# Patient Record
Sex: Male | Born: 1950 | Race: Black or African American | Smoking: Former smoker
Health system: Southern US, Community
[De-identification: ages and names within clinical notes are randomized; demographics above are authoritative.]

## PROBLEM LIST (undated history)

## (undated) DIAGNOSIS — G4730 Sleep apnea, unspecified: Secondary | ICD-10-CM

## (undated) DIAGNOSIS — E785 Hyperlipidemia, unspecified: Secondary | ICD-10-CM

## (undated) DIAGNOSIS — Z86711 Personal history of pulmonary embolism: Secondary | ICD-10-CM

## (undated) DIAGNOSIS — Z86718 Personal history of other venous thrombosis and embolism: Secondary | ICD-10-CM

## (undated) DIAGNOSIS — Z87442 Personal history of urinary calculi: Secondary | ICD-10-CM

## (undated) DIAGNOSIS — I10 Essential (primary) hypertension: Secondary | ICD-10-CM

## (undated) DIAGNOSIS — M1990 Unspecified osteoarthritis, unspecified site: Secondary | ICD-10-CM

## (undated) HISTORY — DX: Hyperlipidemia, unspecified: E78.5

---

## 2013-05-05 DIAGNOSIS — E669 Obesity, unspecified: Secondary | ICD-10-CM | POA: Insufficient documentation

## 2013-06-06 DIAGNOSIS — I10 Essential (primary) hypertension: Secondary | ICD-10-CM | POA: Insufficient documentation

## 2014-04-17 ENCOUNTER — Ambulatory Visit: Payer: Medicaid Other | Attending: Anesthesiology | Admitting: Physical Therapy

## 2014-04-17 DIAGNOSIS — I10 Essential (primary) hypertension: Secondary | ICD-10-CM | POA: Diagnosis not present

## 2014-04-17 DIAGNOSIS — M545 Low back pain, unspecified: Secondary | ICD-10-CM | POA: Insufficient documentation

## 2014-04-17 DIAGNOSIS — R262 Difficulty in walking, not elsewhere classified: Secondary | ICD-10-CM | POA: Insufficient documentation

## 2014-04-17 DIAGNOSIS — M25569 Pain in unspecified knee: Secondary | ICD-10-CM | POA: Diagnosis not present

## 2014-04-17 DIAGNOSIS — IMO0001 Reserved for inherently not codable concepts without codable children: Secondary | ICD-10-CM | POA: Diagnosis present

## 2015-05-29 ENCOUNTER — Other Ambulatory Visit: Payer: Self-pay | Admitting: Orthopedic Surgery

## 2015-06-13 ENCOUNTER — Ambulatory Visit (HOSPITAL_COMMUNITY)
Admission: RE | Admit: 2015-06-13 | Discharge: 2015-06-13 | Disposition: A | Discharge status: Home / Self Care | Payer: Medicaid Other | Source: Ambulatory Visit | Attending: Orthopedic Surgery | Admitting: Orthopedic Surgery

## 2015-06-13 ENCOUNTER — Encounter (HOSPITAL_COMMUNITY): Payer: Self-pay

## 2015-06-13 ENCOUNTER — Encounter (HOSPITAL_COMMUNITY)
Admission: RE | Admit: 2015-06-13 | Discharge: 2015-06-13 | Disposition: A | Discharge status: Home / Self Care | Payer: Medicaid Other | Source: Ambulatory Visit | Attending: Orthopedic Surgery | Admitting: Orthopedic Surgery

## 2015-06-13 DIAGNOSIS — Z01818 Encounter for other preprocedural examination: Secondary | ICD-10-CM | POA: Diagnosis present

## 2015-06-13 DIAGNOSIS — Z0183 Encounter for blood typing: Secondary | ICD-10-CM | POA: Insufficient documentation

## 2015-06-13 DIAGNOSIS — Z01812 Encounter for preprocedural laboratory examination: Secondary | ICD-10-CM | POA: Insufficient documentation

## 2015-06-13 DIAGNOSIS — M1712 Unilateral primary osteoarthritis, left knee: Secondary | ICD-10-CM | POA: Insufficient documentation

## 2015-06-13 DIAGNOSIS — Z01810 Encounter for preprocedural cardiovascular examination: Secondary | ICD-10-CM | POA: Insufficient documentation

## 2015-06-13 HISTORY — DX: Unspecified osteoarthritis, unspecified site: M19.90

## 2015-06-13 HISTORY — DX: Sleep apnea, unspecified: G47.30

## 2015-06-13 HISTORY — DX: Essential (primary) hypertension: I10

## 2015-06-13 LAB — CBC WITH DIFFERENTIAL/PLATELET
BASOS ABS: 0.1 10*3/uL (ref 0.0–0.1)
Basophils Relative: 2 % — ABNORMAL HIGH (ref 0–1)
EOS PCT: 4 % (ref 0–5)
Eosinophils Absolute: 0.2 10*3/uL (ref 0.0–0.7)
HCT: 43 % (ref 39.0–52.0)
HEMOGLOBIN: 14.5 g/dL (ref 13.0–17.0)
Lymphocytes Relative: 30 % (ref 12–46)
Lymphs Abs: 1.4 10*3/uL (ref 0.7–4.0)
MCH: 28 pg (ref 26.0–34.0)
MCHC: 33.7 g/dL (ref 30.0–36.0)
MCV: 83 fL (ref 78.0–100.0)
MONO ABS: 0.5 10*3/uL (ref 0.1–1.0)
Monocytes Relative: 10 % (ref 3–12)
NEUTROS PCT: 54 % (ref 43–77)
Neutro Abs: 2.5 10*3/uL (ref 1.7–7.7)
PLATELETS: 236 10*3/uL (ref 150–400)
RBC: 5.18 MIL/uL (ref 4.22–5.81)
RDW: 14.4 % (ref 11.5–15.5)
WBC: 4.6 10*3/uL (ref 4.0–10.5)

## 2015-06-13 LAB — URINALYSIS, ROUTINE W REFLEX MICROSCOPIC
Bilirubin Urine: NEGATIVE
Glucose, UA: NEGATIVE mg/dL
Hgb urine dipstick: NEGATIVE
KETONES UR: NEGATIVE mg/dL
Leukocytes, UA: NEGATIVE
Nitrite: NEGATIVE
PH: 6 (ref 5.0–8.0)
PROTEIN: NEGATIVE mg/dL
Specific Gravity, Urine: 1.018 (ref 1.005–1.030)
UROBILINOGEN UA: 1 mg/dL (ref 0.0–1.0)

## 2015-06-13 LAB — ABO/RH: ABO/RH(D): O POS

## 2015-06-13 LAB — PROTIME-INR
INR: 1.07 (ref 0.00–1.49)
PROTHROMBIN TIME: 14.1 s (ref 11.6–15.2)

## 2015-06-13 LAB — BASIC METABOLIC PANEL
Anion gap: 9 (ref 5–15)
BUN: 13 mg/dL (ref 6–20)
CO2: 26 mmol/L (ref 22–32)
Calcium: 9.3 mg/dL (ref 8.9–10.3)
Chloride: 102 mmol/L (ref 101–111)
Creatinine, Ser: 0.85 mg/dL (ref 0.61–1.24)
GFR calc non Af Amer: >60 mL/min (ref >60)
GLUCOSE: 97 mg/dL (ref 65–99)
POTASSIUM: 4.1 mmol/L (ref 3.5–5.1)
SODIUM: 137 mmol/L (ref 135–145)

## 2015-06-13 LAB — TYPE AND SCREEN
ABO/RH(D): O POS
Antibody Screen: NEGATIVE

## 2015-06-13 LAB — SURGICAL PCR SCREEN
MRSA, PCR: NEGATIVE
Staphylococcus aureus: NEGATIVE

## 2015-06-13 LAB — APTT: APTT: 34 s (ref 24–37)

## 2015-06-13 NOTE — Pre-Procedure Instructions (Addendum)
Tyler Maynard  06/13/2015     No Pharmacies Listed   Your procedure is scheduled on 06/25/15.  Report to [facility] short stay admitting at 1045 A.M.  Call this number if you have problems the morning of surgery:  [phone]   Remember:  Do not eat food or drink liquids after midnight.  Take these medicines the morning of surgery with A SIP OF WATER pain med     STOP all herbel meds, nsaids (aleve,naproxen,advil,ibuprofen) 5 days prior to surgery starting 06/20/15 including vitamins,aspirin,voltaren gel   Do not wear jewelry, make-up or nail polish.  Do not wear lotions, powders, or perfumes.  You may wear deodorant.  Do not shave 48 hours prior to surgery.  Men may shave face and neck.  Do not bring valuables to the hospital.  [facility] is not responsible for any belongings or valuables.  Contacts, dentures or bridgework may not be worn into surgery.  Leave your suitcase in the car.  After surgery it may be brought to your room.  For patients admitted to the hospital, discharge time will be determined by your treatment team.  Patients discharged the day of surgery will not be allowed to drive home.   Name and phone number of your driver:    Special instructions:   Special Instructions: [place] - Preparing for Surgery  Before surgery, you can play an important role.  Because skin is not sterile, your skin needs to be as free of germs as possible.  You can reduce the number of germs on you skin by washing with CHG (chlorahexidine gluconate) soap before surgery.  CHG is an antiseptic cleaner which kills germs and bonds with the skin to continue killing germs even after washing.  Please DO NOT use if you have an allergy to CHG or antibacterial soaps.  If your skin becomes reddened/irritated stop using the CHG and inform your nurse when you arrive at Short Stay.  Do not shave (including legs and underarms) for at least 48 hours prior to the first CHG shower.  You may shave  your face.  Please follow these instructions carefully:   1.  Shower with CHG Soap the night before surgery and the morning of Surgery.  2.  If you choose to wash your hair, wash your hair first as usual with your normal shampoo.  3.  After you shampoo, rinse your hair and body thoroughly to remove the Shampoo.  4.  Use CHG as you would any other liquid soap.  You can apply chg directly  to the skin and wash gently with scrungie or a clean washcloth.  5.  Apply the CHG Soap to your body ONLY FROM THE NECK DOWN.  Do not use on open wounds or open sores.  Avoid contact with your eyes ears, mouth and genitals (private parts).  Wash genitals (private parts)       with your normal soap.  6.  Wash thoroughly, paying special attention to the area where your surgery will be performed.  7.  Thoroughly rinse your body with warm water from the neck down.  8.  DO NOT shower/wash with your normal soap after using and rinsing off the CHG Soap.  9.  Pat yourself dry with a clean towel.            10.  Wear clean pajamas.            11.  Place clean sheets on your bed the night  of your first shower and do not sleep with pets.  Day of Surgery  Do not apply any lotions/deodorants the morning of surgery.  Please wear clean clothes to the hospital/surgery center.  Please read over the following fact sheets that you were given. Pain Booklet, Coughing and Deep Breathing, Blood Transfusion Information, MRSA Information and Surgical Site Infection Prevention

## 2015-06-13 NOTE — Progress Notes (Signed)
req'd sleep study from [facility] done 3 months ago- borderline.pat has lost 15 lbs

## 2015-06-16 NOTE — Progress Notes (Signed)
Left message with [facility] requesting sleep study

## 2015-06-19 NOTE — Progress Notes (Signed)
Re-requested sleep study from [facility]

## 2015-06-24 MED ORDER — DEXTROSE-NACL 5-0.45 % IV SOLN: INTRAVENOUS | Status: DC

## 2015-06-24 MED ORDER — DEXTROSE 5 % IV SOLN
3.0000 g | INTRAVENOUS | Status: AC
  Administered 2015-06-25: 3 g via INTRAVENOUS
  Filled 2015-06-24: qty 3000

## 2015-06-24 NOTE — H&P (Signed)
TOTAL KNEE ADMISSION H&P  Patient is being admitted for left total knee arthroplasty.  Subjective:  Chief Complaint:left knee pain.  HPI: Tyler Maynard, 64 y.o. male, has a history of pain and functional disability in the left knee due to arthritis and has failed non-surgical conservative treatments for greater than 12 weeks to includeNSAID's and/or analgesics, corticosteriod injections, viscosupplementation injections, flexibility and strengthening excercises, weight reduction as appropriate and activity modification.  Onset of symptoms was gradual, starting 10 years ago with gradually worsening course since that time. The patient noted no past surgery on the left knee(s).  Patient currently rates pain in the left knee(s) at 10 out of 10 with activity. Patient has night pain, worsening of pain with activity and weight bearing, pain that interferes with activities of daily living and pain with passive range of motion.  Patient has evidence of subchondral sclerosis, periarticular osteophytes, joint subluxation and joint space narrowing by imaging studies.  There is no active infection.  There are no active problems to display for this patient.  Past Medical History  Diagnosis Date  . Hypertension   . Sleep apnea     borderline-lost weight  . Arthritis     No past surgical history on file.  No prescriptions prior to admission   No Known Allergies  History  Substance Use Topics  . Smoking status: Former Smoker -- 1.00 packs/day for 10 years    Types: Cigarettes    Quit date: 06/13/1995  . Smokeless tobacco: Not on file  . Alcohol Use: No    No family history on file.   Review of Systems  Constitutional: Positive for weight loss.  HENT: Negative.   Eyes: Negative.   Respiratory: Positive for shortness of breath.   Cardiovascular: Negative.   Gastrointestinal: Negative.   Genitourinary:       ED  Musculoskeletal: Positive for joint pain.  Skin: Negative.   Neurological:  Negative.   Psychiatric/Behavioral: Negative.     Objective:  Physical Exam  Constitutional: He is oriented to person, place, and time. He appears well-developed and well-nourished.  HENT:  Head: Normocephalic and atraumatic.  Eyes: Pupils are equal, round, and reactive to light.  Neck: Normal range of motion. Neck supple.  Cardiovascular: Intact distal pulses.   Respiratory: Effort normal.  Musculoskeletal: He exhibits tenderness.  Patient has impressive bowlegged deformities bilaterally.  Bilateral 10 forward flexion contractures and flex 210 bilaterally.  Skin is intact.  One plus effusion on the left no effusion on the right exquisitely tender along the medial joint lines and there is grinding crepitus as you taken through a range of motion to either knee.  Neurological: He is alert and oriented to person, place, and time.  Skin: Skin is warm and dry.  Psychiatric: He has a normal mood and affect. His behavior is normal. Judgment and thought content normal.    Vital signs in last 24 hours:    Labs:   There is no height or weight on file to calculate BMI.   Imaging Review Plain radiographs demonstrate bilateral 5-10 varus deformities bone-on-bone subluxation of tibias lateral to the femurs, 5 mm.  The varus deformities.  She show some erosion of the tibias quite impressive.  There are some posterior superior osteophytes off the femur.  There also peripheral osteophytes on the patella with loss of cartilage height.  Assessment/Plan:  End stage arthritis, left knee   The patient history, physical examination, clinical judgment of the provider and imaging studies are consistent with  end stage degenerative joint disease of the left knee(s) and total knee arthroplasty is deemed medically necessary. The treatment options including medical management, injection therapy arthroscopy and arthroplasty were discussed at length. The risks and benefits of total knee arthroplasty were  presented and reviewed. The risks due to aseptic loosening, infection, stiffness, patella tracking problems, thromboembolic complications and other imponderables were discussed. The patient acknowledged the explanation, agreed to proceed with the plan and consent was signed. Patient is being admitted for inpatient treatment for surgery, pain control, PT, OT, prophylactic antibiotics, VTE prophylaxis, progressive ambulation and ADL's and discharge planning. The patient is planning to be discharged home with home health services

## 2015-06-25 ENCOUNTER — Inpatient Hospital Stay (HOSPITAL_COMMUNITY): Payer: Medicaid Other | Admitting: Anesthesiology

## 2015-06-25 ENCOUNTER — Inpatient Hospital Stay (HOSPITAL_COMMUNITY)
Admission: RE | Admit: 2015-06-25 | Discharge: 2015-06-27 | DRG: 470 | Disposition: A | Discharge status: Home Health | Payer: Medicaid Other | Source: Ambulatory Visit | Attending: Orthopedic Surgery | Admitting: Orthopedic Surgery

## 2015-06-25 ENCOUNTER — Encounter (HOSPITAL_COMMUNITY)
Admission: RE | Disposition: A | Discharge status: Home Health | Payer: Self-pay | Source: Ambulatory Visit | Attending: Orthopedic Surgery

## 2015-06-25 ENCOUNTER — Encounter (HOSPITAL_COMMUNITY): Payer: Self-pay | Admitting: *Deleted

## 2015-06-25 DIAGNOSIS — M1712 Unilateral primary osteoarthritis, left knee: Principal | ICD-10-CM | POA: Diagnosis present

## 2015-06-25 DIAGNOSIS — M1711 Unilateral primary osteoarthritis, right knee: Secondary | ICD-10-CM | POA: Diagnosis present

## 2015-06-25 DIAGNOSIS — I10 Essential (primary) hypertension: Secondary | ICD-10-CM | POA: Diagnosis not present

## 2015-06-25 DIAGNOSIS — G4730 Sleep apnea, unspecified: Secondary | ICD-10-CM | POA: Diagnosis not present

## 2015-06-25 DIAGNOSIS — Z7982 Long term (current) use of aspirin: Secondary | ICD-10-CM

## 2015-06-25 DIAGNOSIS — Z87891 Personal history of nicotine dependence: Secondary | ICD-10-CM

## 2015-06-25 DIAGNOSIS — D62 Acute posthemorrhagic anemia: Secondary | ICD-10-CM | POA: Diagnosis not present

## 2015-06-25 DIAGNOSIS — Z79899 Other long term (current) drug therapy: Secondary | ICD-10-CM | POA: Diagnosis not present

## 2015-06-25 DIAGNOSIS — M1710 Unilateral primary osteoarthritis, unspecified knee: Secondary | ICD-10-CM | POA: Diagnosis present

## 2015-06-25 DIAGNOSIS — M25562 Pain in left knee: Secondary | ICD-10-CM | POA: Diagnosis present

## 2015-06-25 HISTORY — PX: TOTAL KNEE ARTHROPLASTY: SHX125

## 2015-06-25 SURGERY — ARTHROPLASTY, KNEE, TOTAL
Anesthesia: Spinal | Site: Knee | Laterality: Left

## 2015-06-25 MED ORDER — LACTATED RINGERS IV SOLN
INTRAVENOUS | Status: DC | PRN
  Administered 2015-06-25 (×2): via INTRAVENOUS

## 2015-06-25 MED ORDER — HYDROMORPHONE HCL 1 MG/ML IJ SOLN
INTRAMUSCULAR | Status: AC
  Filled 2015-06-25: qty 1

## 2015-06-25 MED ORDER — METHOCARBAMOL 500 MG PO TABS
500.0000 mg | ORAL_TABLET | Freq: Four times a day (QID) | ORAL | Status: DC | PRN
  Administered 2015-06-25 – 2015-06-27 (×7): 500 mg via ORAL
  Filled 2015-06-25 (×6): qty 1

## 2015-06-25 MED ORDER — PROPOFOL 10 MG/ML IV BOLUS
INTRAVENOUS | Status: AC
  Filled 2015-06-25: qty 20

## 2015-06-25 MED ORDER — LIDOCAINE HCL (CARDIAC) 20 MG/ML IV SOLN
INTRAVENOUS | Status: AC
  Filled 2015-06-25: qty 5

## 2015-06-25 MED ORDER — CEFUROXIME SODIUM 1.5 G IJ SOLR
INTRAMUSCULAR | Status: DC | PRN
  Administered 2015-06-25: 1.5 g

## 2015-06-25 MED ORDER — FLEET ENEMA 7-19 GM/118ML RE ENEM: 1.0000 | ENEMA | Freq: Once | RECTAL | Status: AC | PRN

## 2015-06-25 MED ORDER — ACETAMINOPHEN 325 MG PO TABS
650.0000 mg | ORAL_TABLET | Freq: Four times a day (QID) | ORAL | Status: DC | PRN
  Administered 2015-06-26 – 2015-06-27 (×2): 650 mg via ORAL
  Filled 2015-06-25 (×2): qty 2

## 2015-06-25 MED ORDER — DEXTROSE 5 % IV SOLN: 500.0000 mg | Freq: Four times a day (QID) | INTRAVENOUS | Status: DC | PRN

## 2015-06-25 MED ORDER — METOCLOPRAMIDE HCL 5 MG PO TABS
5.0000 mg | ORAL_TABLET | Freq: Three times a day (TID) | ORAL | Status: DC | PRN
Start: 2015-06-25 — End: 2015-06-27

## 2015-06-25 MED ORDER — HYDROMORPHONE HCL 1 MG/ML IJ SOLN
0.5000 mg | INTRAMUSCULAR | Status: DC | PRN
  Administered 2015-06-25 – 2015-06-26 (×8): 1 mg via INTRAVENOUS
  Filled 2015-06-25 (×8): qty 1

## 2015-06-25 MED ORDER — MENTHOL 3 MG MT LOZG
1.0000 | LOZENGE | OROMUCOSAL | Status: DC | PRN
Start: 2015-06-25 — End: 2015-06-27

## 2015-06-25 MED ORDER — ONDANSETRON HCL 4 MG PO TABS: 4.0000 mg | ORAL_TABLET | Freq: Four times a day (QID) | ORAL | Status: DC | PRN

## 2015-06-25 MED ORDER — PHENOL 1.4 % MT LIQD: 1.0000 | OROMUCOSAL | Status: DC | PRN

## 2015-06-25 MED ORDER — ACETAMINOPHEN 650 MG RE SUPP: 650.0000 mg | Freq: Four times a day (QID) | RECTAL | Status: DC | PRN

## 2015-06-25 MED ORDER — METHOCARBAMOL 500 MG PO TABS: 500.0000 mg | ORAL_TABLET | Freq: Two times a day (BID) | ORAL | Status: DC

## 2015-06-25 MED ORDER — LISINOPRIL-HYDROCHLOROTHIAZIDE 20-25 MG PO TABS: 1.0000 | ORAL_TABLET | Freq: Every day | ORAL | Status: DC

## 2015-06-25 MED ORDER — ROCURONIUM BROMIDE 50 MG/5ML IV SOLN
INTRAVENOUS | Status: AC
  Filled 2015-06-25: qty 1

## 2015-06-25 MED ORDER — MONTELUKAST SODIUM 10 MG PO TABS: 10.0000 mg | ORAL_TABLET | Freq: Every evening | ORAL | Status: DC | PRN

## 2015-06-25 MED ORDER — BUPIVACAINE LIPOSOME 1.3 % IJ SUSP
20.0000 mL | INTRAMUSCULAR | Status: DC
  Filled 2015-06-25: qty 20

## 2015-06-25 MED ORDER — DIPHENHYDRAMINE HCL 12.5 MG/5ML PO ELIX
12.5000 mg | ORAL_SOLUTION | ORAL | Status: DC | PRN
Start: 2015-06-25 — End: 2015-06-27

## 2015-06-25 MED ORDER — SENNOSIDES-DOCUSATE SODIUM 8.6-50 MG PO TABS: 1.0000 | ORAL_TABLET | Freq: Every evening | ORAL | Status: DC | PRN

## 2015-06-25 MED ORDER — GEMFIBROZIL 600 MG PO TABS
600.0000 mg | ORAL_TABLET | Freq: Two times a day (BID) | ORAL | Status: DC
  Administered 2015-06-26 – 2015-06-27 (×4): 600 mg via ORAL
  Filled 2015-06-25 (×6): qty 1

## 2015-06-25 MED ORDER — LACTATED RINGERS IV SOLN
INTRAVENOUS | Status: DC
  Administered 2015-06-25: 10:00:00 via INTRAVENOUS

## 2015-06-25 MED ORDER — METOCLOPRAMIDE HCL 5 MG/ML IJ SOLN
5.0000 mg | Freq: Three times a day (TID) | INTRAMUSCULAR | Status: DC | PRN
Start: 2015-06-25 — End: 2015-06-27

## 2015-06-25 MED ORDER — DOCUSATE SODIUM 100 MG PO CAPS
100.0000 mg | ORAL_CAPSULE | Freq: Two times a day (BID) | ORAL | Status: DC
  Administered 2015-06-25 – 2015-06-27 (×4): 100 mg via ORAL
  Filled 2015-06-25 (×4): qty 1

## 2015-06-25 MED ORDER — ALUM & MAG HYDROXIDE-SIMETH 200-200-20 MG/5ML PO SUSP
30.0000 mL | ORAL | Status: DC | PRN
  Administered 2015-06-27: 30 mL via ORAL
  Filled 2015-06-25: qty 30

## 2015-06-25 MED ORDER — MIDAZOLAM HCL 5 MG/5ML IJ SOLN
INTRAMUSCULAR | Status: DC | PRN
  Administered 2015-06-25: 2 mg via INTRAVENOUS

## 2015-06-25 MED ORDER — PROPOFOL INFUSION 10 MG/ML OPTIME
INTRAVENOUS | Status: DC | PRN
  Administered 2015-06-25: 100 ug/kg/min via INTRAVENOUS

## 2015-06-25 MED ORDER — OXYCODONE HCL 5 MG PO TABS
5.0000 mg | ORAL_TABLET | ORAL | Status: DC | PRN
  Administered 2015-06-25 – 2015-06-27 (×11): 10 mg via ORAL
  Filled 2015-06-25 (×10): qty 2

## 2015-06-25 MED ORDER — BUPIVACAINE LIPOSOME 1.3 % IJ SUSP
INTRAMUSCULAR | Status: DC | PRN
  Administered 2015-06-25: 20 mL

## 2015-06-25 MED ORDER — CEFUROXIME SODIUM 1.5 G IJ SOLR
INTRAMUSCULAR | Status: AC
  Filled 2015-06-25: qty 1.5

## 2015-06-25 MED ORDER — SODIUM CHLORIDE 0.9 % IR SOLN
Status: DC | PRN
  Administered 2015-06-25: 1000 mL
  Administered 2015-06-25: 3000 mL

## 2015-06-25 MED ORDER — OXYCODONE-ACETAMINOPHEN 5-325 MG PO TABS: 1.0000 | ORAL_TABLET | ORAL | Status: DC | PRN

## 2015-06-25 MED ORDER — SODIUM CHLORIDE 0.9 % IJ SOLN
INTRAMUSCULAR | Status: AC
  Filled 2015-06-25: qty 10

## 2015-06-25 MED ORDER — VITAMIN D3 25 MCG (1000 UNIT) PO TABS
1000.0000 [IU] | ORAL_TABLET | Freq: Every day | ORAL | Status: DC
  Administered 2015-06-25 – 2015-06-27 (×3): 1000 [IU] via ORAL
  Filled 2015-06-25 (×6): qty 1

## 2015-06-25 MED ORDER — CHLORHEXIDINE GLUCONATE 4 % EX LIQD: 60.0000 mL | Freq: Once | CUTANEOUS | Status: DC

## 2015-06-25 MED ORDER — BUPIVACAINE IN DEXTROSE 0.75-8.25 % IT SOLN
INTRATHECAL | Status: DC | PRN
  Administered 2015-06-25: 15 mg via INTRATHECAL

## 2015-06-25 MED ORDER — OXYCODONE HCL 5 MG PO TABS
ORAL_TABLET | ORAL | Status: AC
  Filled 2015-06-25: qty 2

## 2015-06-25 MED ORDER — ATORVASTATIN CALCIUM 10 MG PO TABS
10.0000 mg | ORAL_TABLET | Freq: Every day | ORAL | Status: DC
  Administered 2015-06-25 – 2015-06-26 (×2): 10 mg via ORAL
  Filled 2015-06-25 (×2): qty 1

## 2015-06-25 MED ORDER — MIDAZOLAM HCL 2 MG/2ML IJ SOLN
INTRAMUSCULAR | Status: AC
  Filled 2015-06-25: qty 2

## 2015-06-25 MED ORDER — PHENYLEPHRINE 40 MCG/ML (10ML) SYRINGE FOR IV PUSH (FOR BLOOD PRESSURE SUPPORT)
PREFILLED_SYRINGE | INTRAVENOUS | Status: AC
  Filled 2015-06-25: qty 10

## 2015-06-25 MED ORDER — HYDROCHLOROTHIAZIDE 25 MG PO TABS
25.0000 mg | ORAL_TABLET | Freq: Every day | ORAL | Status: DC
  Administered 2015-06-25 – 2015-06-27 (×3): 25 mg via ORAL
  Filled 2015-06-25 (×3): qty 1

## 2015-06-25 MED ORDER — BISACODYL 5 MG PO TBEC: 5.0000 mg | DELAYED_RELEASE_TABLET | Freq: Every day | ORAL | Status: DC | PRN

## 2015-06-25 MED ORDER — ONDANSETRON HCL 4 MG/2ML IJ SOLN: 4.0000 mg | Freq: Four times a day (QID) | INTRAMUSCULAR | Status: DC | PRN

## 2015-06-25 MED ORDER — FENTANYL CITRATE (PF) 250 MCG/5ML IJ SOLN
INTRAMUSCULAR | Status: AC
  Filled 2015-06-25: qty 5

## 2015-06-25 MED ORDER — HYDROMORPHONE HCL 1 MG/ML IJ SOLN
0.2500 mg | INTRAMUSCULAR | Status: DC | PRN
  Administered 2015-06-25 (×4): 0.5 mg via INTRAVENOUS

## 2015-06-25 MED ORDER — ONDANSETRON HCL 4 MG/2ML IJ SOLN
INTRAMUSCULAR | Status: AC
  Filled 2015-06-25: qty 2

## 2015-06-25 MED ORDER — METHOCARBAMOL 500 MG PO TABS
ORAL_TABLET | ORAL | Status: AC
Start: 2015-06-25 — End: 2015-06-26
  Filled 2015-06-25: qty 1

## 2015-06-25 MED ORDER — KCL IN DEXTROSE-NACL 20-5-0.45 MEQ/L-%-% IV SOLN
INTRAVENOUS | Status: DC
  Administered 2015-06-25 – 2015-06-26 (×2): via INTRAVENOUS
  Filled 2015-06-25 (×2): qty 1000

## 2015-06-25 MED ORDER — LIDOCAINE HCL (CARDIAC) 20 MG/ML IV SOLN
INTRAVENOUS | Status: DC | PRN
  Administered 2015-06-25: 60 mg via INTRAVENOUS

## 2015-06-25 MED ORDER — ASPIRIN EC 325 MG PO TBEC: 325.0000 mg | DELAYED_RELEASE_TABLET | Freq: Two times a day (BID) | ORAL | Status: DC

## 2015-06-25 MED ORDER — LISINOPRIL 20 MG PO TABS
20.0000 mg | ORAL_TABLET | Freq: Every day | ORAL | Status: DC
  Administered 2015-06-25 – 2015-06-27 (×3): 20 mg via ORAL
  Filled 2015-06-25 (×3): qty 1

## 2015-06-25 MED ORDER — ASPIRIN EC 325 MG PO TBEC
325.0000 mg | DELAYED_RELEASE_TABLET | Freq: Every day | ORAL | Status: DC
  Administered 2015-06-26 – 2015-06-27 (×2): 325 mg via ORAL
  Filled 2015-06-25 (×2): qty 1

## 2015-06-25 MED ORDER — SUCCINYLCHOLINE CHLORIDE 20 MG/ML IJ SOLN
INTRAMUSCULAR | Status: AC
  Filled 2015-06-25: qty 1

## 2015-06-25 MED ORDER — EPHEDRINE SULFATE 50 MG/ML IJ SOLN
INTRAMUSCULAR | Status: AC
  Filled 2015-06-25: qty 1

## 2015-06-25 MED ORDER — SODIUM CHLORIDE 0.9 % IV SOLN
2000.0000 mg | Freq: Once | INTRAVENOUS | Status: AC
Start: 2015-06-25 — End: 2015-06-25
  Administered 2015-06-25: 2000 mg via TOPICAL
  Filled 2015-06-25: qty 20

## 2015-06-25 SURGICAL SUPPLY — 54 items
BANDAGE ESMARK 6X9 LF (GAUZE/BANDAGES/DRESSINGS) ×1 IMPLANT
BLADE SAG 18X100X1.27 (BLADE) ×3 IMPLANT
BLADE SAW SGTL 13X75X1.27 (BLADE) ×3 IMPLANT
BLADE SURG ROTATE 9660 (MISCELLANEOUS) IMPLANT
BNDG ELASTIC 6X10 VLCR STRL LF (GAUZE/BANDAGES/DRESSINGS) ×3 IMPLANT
BNDG ESMARK 6X9 LF (GAUZE/BANDAGES/DRESSINGS) ×3
BOWL SMART MIX CTS (DISPOSABLE) ×3 IMPLANT
CAPT KNEE TOTAL 3 ATTUNE ×3 IMPLANT
CEMENT HV SMART SET (Cement) ×6 IMPLANT
COVER SURGICAL LIGHT HANDLE (MISCELLANEOUS) ×3 IMPLANT
CUFF TOURNIQUET SINGLE 34IN LL (TOURNIQUET CUFF) ×3 IMPLANT
CUFF TOURNIQUET SINGLE 44IN (TOURNIQUET CUFF) IMPLANT
DRAPE EXTREMITY T 121X128X90 (DRAPE) ×3 IMPLANT
DRAPE U-SHAPE 47X51 STRL (DRAPES) ×3 IMPLANT
DURAPREP 26ML APPLICATOR (WOUND CARE) ×6 IMPLANT
ELECT REM PT RETURN 9FT ADLT (ELECTROSURGICAL) ×3
ELECTRODE REM PT RTRN 9FT ADLT (ELECTROSURGICAL) ×1 IMPLANT
EVACUATOR 1/8 PVC DRAIN (DRAIN) IMPLANT
GAUZE SPONGE 4X4 12PLY STRL (GAUZE/BANDAGES/DRESSINGS) ×3 IMPLANT
GAUZE XEROFORM 1X8 LF (GAUZE/BANDAGES/DRESSINGS) ×3 IMPLANT
GLOVE BIO SURGEON STRL SZ7.5 (GLOVE) ×3 IMPLANT
GLOVE BIO SURGEON STRL SZ8.5 (GLOVE) ×3 IMPLANT
GLOVE BIOGEL PI IND STRL 8 (GLOVE) ×1 IMPLANT
GLOVE BIOGEL PI IND STRL 9 (GLOVE) ×1 IMPLANT
GLOVE BIOGEL PI INDICATOR 8 (GLOVE) ×2
GLOVE BIOGEL PI INDICATOR 9 (GLOVE) ×2
GOWN STRL REUS W/ TWL LRG LVL3 (GOWN DISPOSABLE) ×2 IMPLANT
GOWN STRL REUS W/ TWL XL LVL3 (GOWN DISPOSABLE) ×2 IMPLANT
GOWN STRL REUS W/TWL LRG LVL3 (GOWN DISPOSABLE) ×4
GOWN STRL REUS W/TWL XL LVL3 (GOWN DISPOSABLE) ×4
HANDPIECE INTERPULSE COAX TIP (DISPOSABLE) ×2
HOOD PEEL AWAY FACE SHEILD DIS (HOOD) ×6 IMPLANT
KIT BASIN OR (CUSTOM PROCEDURE TRAY) ×3 IMPLANT
KIT ROOM TURNOVER OR (KITS) ×3 IMPLANT
MANIFOLD NEPTUNE II (INSTRUMENTS) ×3 IMPLANT
NEEDLE SPNL 18GX3.5 QUINCKE PK (NEEDLE) ×3 IMPLANT
NS IRRIG 1000ML POUR BTL (IV SOLUTION) ×3 IMPLANT
PACK TOTAL JOINT (CUSTOM PROCEDURE TRAY) ×3 IMPLANT
PACK UNIVERSAL I (CUSTOM PROCEDURE TRAY) ×3 IMPLANT
PAD ARMBOARD 7.5X6 YLW CONV (MISCELLANEOUS) ×6 IMPLANT
PADDING CAST COTTON 6X4 STRL (CAST SUPPLIES) ×3 IMPLANT
SET HNDPC FAN SPRY TIP SCT (DISPOSABLE) ×1 IMPLANT
SUT VIC AB 0 CT1 27 (SUTURE) ×2
SUT VIC AB 0 CT1 27XBRD ANBCTR (SUTURE) ×1 IMPLANT
SUT VIC AB 1 CTX 36 (SUTURE) ×2
SUT VIC AB 1 CTX36XBRD ANBCTR (SUTURE) ×1 IMPLANT
SUT VIC AB 2-0 CT1 27 (SUTURE) ×2
SUT VIC AB 2-0 CT1 TAPERPNT 27 (SUTURE) ×1 IMPLANT
SUT VIC AB 3-0 CT1 27 (SUTURE) ×2
SUT VIC AB 3-0 CT1 TAPERPNT 27 (SUTURE) ×1 IMPLANT
SUT VIC AB 3-0 FS2 27 (SUTURE) ×3 IMPLANT
SYR 50ML LL SCALE MARK (SYRINGE) ×3 IMPLANT
TOWEL OR 17X24 6PK STRL BLUE (TOWEL DISPOSABLE) ×3 IMPLANT
TOWEL OR 17X26 10 PK STRL BLUE (TOWEL DISPOSABLE) ×3 IMPLANT

## 2015-06-25 NOTE — Transfer of Care (Signed)
Immediate Anesthesia Transfer of Care Note  Patient: Tyler Maynard  Procedure(s) Performed: Procedure(s) with comments: TOTAL KNEE ARTHROPLASTY (Left) - Left Total Knee Arthroplasty  Patient Location: PACU  Anesthesia Type:Spinal  Level of Consciousness: awake, alert , oriented and patient cooperative  Airway & Oxygen Therapy: Patient Spontanous Breathing and Patient connected to nasal cannula oxygen  Post-op Assessment: Report given to RN, Post -op Vital signs reviewed and stable and Patient moving all extremities  Post vital signs: Reviewed and stable  Last Vitals:  Filed Vitals:   06/25/15 0939  BP: 138/86  Pulse: 78  Temp: 36.8 C  Resp: 20    Complications: No apparent anesthesia complications

## 2015-06-25 NOTE — Progress Notes (Signed)
Orthopedic Tech Progress Note Patient Details:  Tyler Maynard [birth year] [id number]  CPM Left Knee CPM Left Knee: On Left Knee Flexion (Degrees): 40 Left Knee Extension (Degrees): 0 Additional Comments: trapeze barb paient helper Viewed order from doctor's order list  Hildred Priest 06/25/2015, 2:49 PM

## 2015-06-25 NOTE — Anesthesia Preprocedure Evaluation (Addendum)
Anesthesia Evaluation  Patient identified by MRN, date of birth, ID band Patient awake    Reviewed: Allergy & Precautions, H&P , NPO status , Patient's Chart, lab work & pertinent test results  Airway Mallampati: II  TM Distance: >3 FB Neck ROM: Full    Dental no notable dental hx. (+) Partial Upper, Dental Advisory Given   Pulmonary sleep apnea , former smoker,  breath sounds clear to auscultation  Pulmonary exam normal       Cardiovascular hypertension, Pt. on medications Rhythm:Regular Rate:Normal     Neuro/Psych negative neurological ROS  negative psych ROS   GI/Hepatic negative GI ROS, Neg liver ROS,   Endo/Other  Morbid obesity  Renal/GU negative Renal ROS  negative genitourinary   Musculoskeletal  (+) Arthritis -, Osteoarthritis,    Abdominal   Peds  Hematology negative hematology ROS (+)   Anesthesia Other Findings   Reproductive/Obstetrics negative OB ROS                           Anesthesia Physical Anesthesia Plan  ASA: III  Anesthesia Plan: Spinal   Post-op Pain Management:    Induction: Intravenous  Airway Management Planned: Simple Face Mask  Additional Equipment:   Intra-op Plan:   Post-operative Plan:   Informed Consent: I have reviewed the patients History and Physical, chart, labs and discussed the procedure including the risks, benefits and alternatives for the proposed anesthesia with the patient or authorized representative who has indicated his/her understanding and acceptance.   Dental advisory given  Plan Discussed with: CRNA  Anesthesia Plan Comments:         Anesthesia Quick Evaluation

## 2015-06-25 NOTE — Interval H&P Note (Signed)
History and Physical Interval Note:  06/25/2015 11:26 AM  Tyler Maynard  has presented today for surgery, with the diagnosis of Primary Osteoarthritis of left Knee  The various methods of treatment have been discussed with the patient and family. After consideration of risks, benefits and other options for treatment, the patient has consented to  Procedure(s) with comments: TOTAL KNEE ARTHROPLASTY (Left) - Left Total Knee Arthroplasty as a surgical intervention .  The patient's history has been reviewed, patient examined, no change in status, stable for surgery.  I have reviewed the patient's chart and labs.  Questions were answered to the patient's satisfaction.     Kerin Salen

## 2015-06-25 NOTE — Progress Notes (Signed)
Report given to robin roberts rn as caregiver 

## 2015-06-25 NOTE — Op Note (Signed)
PATIENT ID:      Tyler Maynard  MRN:     [id number] DOB/AGE:    07-08-1951 / 64 y.o.       OPERATIVE REPORT    DATE OF PROCEDURE:  06/25/2015       PREOPERATIVE DIAGNOSIS:   Primary Osteoarthritis of left Knee      Estimated body mass index is 38.24 kg/(m^2) as calculated from the following:   Height as of this encounter: 6' (1.829 m).   Weight as of this encounter: 127.914 kg (282 lb).                                                        POSTOPERATIVE DIAGNOSIS:   primary osteoarthritis left knee                                                                      PROCEDURE:  Procedure(s): TOTAL KNEE ARTHROPLASTY Using DepuyAttune RP implants #9L Femur, #9Tibia, 7 mm Attune RP bearing, 41 Patella     SURGEON: Carlos Quackenbush J    ASSISTANT:   Eric K[employer]   (Present and scrubbed throughout the case, critical for assistance with exposure, retraction, instrumentation, and closure.)         ANESTHESIA: Spinal, Exparel  EBL: 300  FLUID REPLACEMENT: 1600 crystalloid  TOURNIQUET TIME: 58min  Drains: None  Tranexamic Acid: 2gm topical   COMPLICATIONS:  None         INDICATIONS FOR PROCEDURE: The patient has  Primary Osteoarthritis of left Knee, varus deformities, XR shows bone on bone arthritis. Patient has failed all conservative measures including anti-inflammatory medicines, narcotics, attempts at  exercise and weight loss, cortisone injections and viscosupplementation.  Risks and benefits of surgery have been discussed, questions answered.   DESCRIPTION OF PROCEDURE: The patient identified by armband, received  IV antibiotics, in the holding area at [facility]. Patient taken to the operating room, appropriate anesthetic  monitors were attached, and general endotracheal anesthesia induced with  the patient in supine position. Tourniquet  applied high to the operative thigh. Lateral post and foot positioner  applied to the table, the lower extremity was then prepped and  draped  in usual sterile fashion from the ankle to the tourniquet. Time-out procedure was performed. We began the operation, with the knee flexed 100 degrees, by making the anterior midline incision starting at handbreadth above the patella going over the patella 1 cm medial to and 4 cm distal to the tibial tubercle. Small bleeders in the skin and the  subcutaneous tissue identified and cauterized. Transverse retinaculum was incised and reflected medially and a medial parapatellar arthrotomy was accomplished. the patella was everted and theprepatellar fat pad resected. The superficial medial collateral  ligament was then elevated from anterior to posterior along the proximal  flare of the tibia and anterior half of the menisci resected. The knee was hyperflexed exposing bone on bone arthritis. Peripheral and notch osteophytes as well as the cruciate ligaments were then resected. We continued to  work our way around posteriorly along the proximal tibia,  and externally  rotated the tibia subluxing it out from underneath the femur. A McHale  retractor was placed through the notch and a lateral Hohmann retractor  placed, and we then drilled through the proximal tibia in line with the  axis of the tibia followed by an intramedullary guide rod and 2-degree  posterior slope cutting guide. The tibial cutting guide, 3 degree posterior sloped, was pinned into place allowing resection of 3 mm of bone medially and about 12 mm of bone laterally. Satisfied with the tibial resection, we then  entered the distal femur 2 mm anterior to the PCL origin with the  intramedullary guide rod and applied the distal femoral cutting guide  set at 42mm, with 5 degrees of valgus. This was pinned along the  epicondylar axis. At this point, the distal femoral cut was accomplished without difficulty. We then sized for a #9L femoral component and pinned the guide in 3 degrees of external rotation.The chamfer cutting guide was pinned  into place. The anterior, posterior, and chamfer cuts were accomplished without difficulty followed by  the Attune RP box cutting guide and the box cut. We also removed posterior osteophytes from the posterior femoral condyles. At this  time, the knee was brought into full extension. We checked our  extension and flexion gaps and found them symmetric for a 7 mm bearing. Distracting in extension with a lamina spreader, the posterior horns of the menisci were removed, and Exparel, diluted to 60 cc, was injected into the capsule of the knee. The  posterior patella cut was accomplished with the 9.5 mm Attune cutting guide, sized at 41 dome, and the fixation pegs drilled.The knee  was then once again hyperflexed exposing the proximal tibia. We sized for a #9 tibial base plate, applied the smokestack and the conical reamer followed by the the Delta fin keel punch. We then hammered into place the Attune RP trial femoral component, inserted a  7 mm trial bearing, trial patellar button, and took the knee through range of motion from 0-130 degrees. No thumb pressure was required for patellar  Tracking. At this point, the limb was wrapped with an Esmarch bandage and the tourniquet inflated to 350 mmHg. All trial components were removed, mating surfaces irrigated with pulse lavage, and dried with suction and sponges. A double batch of DePuy HV cement with 1500 mg of Zinacef was mixed and applied to all bony metallic mating surfaces except for the posterior condyles of the femur itself. In order, we  hammered into place the tibial tray and removed excess cement, the femoral component and removed excess cement,  The 7 mm  Attune RP bearing  was inserted, and the knee brought to full extension with compression.  The patellar button was clamped into place, and excess cement  removed. While the cement cured the wound was irrigated out with normal saline solution pulse lavage. Ligament stability and patellar tracking were  checked and found to be excellent. The parapatellar arthrotomy was closed with  running #1 Vicryl suture. The subcutaneous tissue with 0 and 2-0 undyed  Vicryl suture, and the skin with running 3-0 SQ vicryl. A dressing of Xeroform,  4 x 4, dressing sponges, Webril, and Ace wrap applied. The patient  awakened, extubated, and taken to recovery room without difficulty.   Daniela Hernan J 06/25/2015, 1:15 PM

## 2015-06-25 NOTE — Discharge Instructions (Signed)

## 2015-06-25 NOTE — Progress Notes (Signed)
Orthopedic Tech Progress Note Patient Details:  Tyler Maynard [birth year] [id number] On cpm at 7:20 pm Patient ID: Tyler Maynard, male   DOB: [birth year], 64 y.o.   MRN: [id number]   Tyler Maynard 06/25/2015, 7:21 PM

## 2015-06-25 NOTE — Anesthesia Procedure Notes (Addendum)
Spinal Patient location during procedure: OR Start time: 06/25/2015 11:39 AM End time: 06/25/2015 11:44 AM Staffing Anesthesiologist: Roderic Palau Performed by: anesthesiologist  Preanesthetic Checklist Completed: patient identified, surgical consent, pre-op evaluation, timeout performed, IV checked, risks and benefits discussed and monitors and equipment checked Spinal Block Patient position: sitting Prep: DuraPrep Patient monitoring: cardiac monitor, continuous pulse ox and blood pressure Approach: midline Location: L3-4 Injection technique: single-shot Needle Needle type: Pencan  Needle gauge: 24 G Needle length: 10 cm Assessment Sensory level: T8 Events: paresthesia Additional Notes Functioning IV was confirmed and monitors were applied. Sterile prep and drape, including hand hygiene and sterile gloves were used. The patient was positioned and the spine was prepped. The skin was anesthetized with lidocaine.  Free flow of clear CSF was obtained prior to injecting local anesthetic into the CSF.  The spinal needle aspirated freely following injection.  The needle was carefully withdrawn.  The patient tolerated the procedure well.   Date/Time: 06/25/2015 11:45 AM Performed by: Izora Gala Pre-anesthesia Checklist: Patient identified, Emergency Drugs available, Suction available, Patient being monitored and Timeout performed Patient Re-evaluated:Patient Re-evaluated prior to inductionOxygen Delivery Method: Simple face mask Preoxygenation: Pre-oxygenation with 100% oxygen (Spontaneous airway under spinal anesthesia) Placement Confirmation: positive ETCO2

## 2015-06-26 ENCOUNTER — Encounter (HOSPITAL_COMMUNITY): Payer: Self-pay | Admitting: General Practice

## 2015-06-26 LAB — BASIC METABOLIC PANEL
Anion gap: 7 (ref 5–15)
BUN: 7 mg/dL (ref 6–20)
CO2: 29 mmol/L (ref 22–32)
Calcium: 8.7 mg/dL — ABNORMAL LOW (ref 8.9–10.3)
Chloride: 98 mmol/L — ABNORMAL LOW (ref 101–111)
Creatinine, Ser: 0.87 mg/dL (ref 0.61–1.24)
GFR calc Af Amer: >60 mL/min (ref >60)
GFR calc non Af Amer: >60 mL/min (ref >60)
GLUCOSE: 124 mg/dL — AB (ref 65–99)
Potassium: 3.7 mmol/L (ref 3.5–5.1)
Sodium: 134 mmol/L — ABNORMAL LOW (ref 135–145)

## 2015-06-26 LAB — CBC
HCT: 37.1 % — ABNORMAL LOW (ref 39.0–52.0)
Hemoglobin: 12.5 g/dL — ABNORMAL LOW (ref 13.0–17.0)
MCH: 28 pg (ref 26.0–34.0)
MCHC: 33.7 g/dL (ref 30.0–36.0)
MCV: 83 fL (ref 78.0–100.0)
Platelets: 252 10*3/uL (ref 150–400)
RBC: 4.47 MIL/uL (ref 4.22–5.81)
RDW: 14.7 % (ref 11.5–15.5)
WBC: 9.5 10*3/uL (ref 4.0–10.5)

## 2015-06-26 NOTE — Progress Notes (Signed)
Patient ID: Tyler Maynard, male   DOB: January 05, 1951, 64 y.o.   MRN: [id number] PATIENT ID: Tyler Maynard  MRN: [id number]  DOB/AGE:  11-02-1951 / 64 y.o.  1 Day Post-Op Procedure(s) (LRB): TOTAL KNEE ARTHROPLASTY (Left)    PROGRESS NOTE Subjective: Patient is alert, oriented, no Nausea, no Vomiting, yes passing gas, no Bowel Movement. Taking PO well. Denies SOB, Chest or Calf Pain. Using Incentive Spirometer, PAS in place. Ambulate WBAT, CPM 0-40 Patient reports pain as 5 on 0-10 scale  .    Objective: Vital signs in last 24 hours: Filed Vitals:   06/25/15 1630 06/25/15 1734 06/25/15 2042 06/26/15 0215  BP: 146/84 142/87 164/88 142/89  Pulse: 85 86 103 98  Temp:  99 F (37.2 C) 99 F (37.2 C) 99.5 F (37.5 C)  TempSrc:  Axillary Oral   Resp: 23 20 19 19   Height:      Weight:      SpO2: 100% 100% 98% 99%      Intake/Output from previous day: I/O last 3 completed shifts: In: 2775 [P.O.:100; I.V.:2675] Out: 8867 [Urine:2450; Blood:100]   Intake/Output this shift:     LABORATORY DATA:  Recent Labs  06/26/15 0442  WBC 9.5  HGB 12.5*  HCT 37.1*  PLT 252  NA 134*  K 3.7  CL 98*  CO2 29  BUN 7  CREATININE 0.87  GLUCOSE 124*  CALCIUM 8.7*    Examination: Neurologically intact ABD soft Neurovascular intact Sensation intact distally Intact pulses distally Dorsiflexion/Plantar flexion intact Incision: dressing C/D/I No cellulitis present Compartment soft}  Assessment:   1 Day Post-Op Procedure(s) (LRB): TOTAL KNEE ARTHROPLASTY (Left) ADDITIONAL DIAGNOSIS: Expected Acute Blood Loss Anemia, HTN  Plan: PT/OT WBAT, CPM 5/hrs day until ROM 0-90 degrees, then D/C CPM DVT Prophylaxis:  SCDx72hrs, ASA 325 mg BID x 2 weeks DISCHARGE PLAN: Home DISCHARGE NEEDS: HHPT, CPM, Walker and 3-in-1 comode seat     Jeanni Allshouse J 06/26/2015, 8:21 AM

## 2015-06-26 NOTE — Care Management Note (Signed)
Case Management Note  Patient Details  Name: Tyler Maynard MRN: [id number] Date of Birth: Feb 23, 1951  Subjective/Objective:    64 yr old male  64 yr old male s/p left total knee arthroplasty.             Action/Plan:   Expected Discharge Date:                  Expected Discharge Plan:     In-House Referral:  NA  Discharge planning Services  CM Consult  Post Acute Care Choice:  Durable Medical Equipment, Home Health Choice offered to:  Patient  DME Arranged:  3-N-1, CPM, Walker rolling DME Agency:  [employer]  HH Arranged:  PT [place] Agency:  [place]  Status of Service:  Completed, signed off  Medicare Important Message Given:    Date Medicare IM Given:    Medicare IM give by:    Date Additional Medicare IM Given:    Additional Medicare Important Message give by:     If discussed at [place] of Stay Meetings, dates discussed:    Additional Comments:  Ninfa Meeker, RN 06/26/2015, 11:35 AM

## 2015-06-26 NOTE — Evaluation (Signed)
Physical Therapy Evaluation Patient Details Name: Tyler Maynard MRN: [id number] DOB: Dec 30, 1950 Today's Date: 06/26/2015   History of Present Illness  Pt is a 64 y/o M s/p L TKA.  Pt's PMH includes HTN.  Clinical Impression  Pt is s/p L TKA resulting in the deficits listed below (see PT Problem List). Pt limited by fatigue and pain but able to ambulate 50 ft w/ min guard assist.  Mod +2 assist for sit<>stand and max +2 assist for supine>sit. Pt would like to return home upon d/c if possible depending on his functional progression.  Pt lives with girlfriend who has "bad knees" and will be able to provide limited physical assist.  Pt will benefit from skilled PT to increase their independence and safety with mobility to allow discharge to the venue listed below.     Follow Up Recommendations Home health PT;Supervision/Assistance - 24 hour    Equipment Recommendations  Rolling walker with 5" wheels    Recommendations for Other Services       Precautions / Restrictions Precautions Precautions: Fall;Knee Precaution Booklet Issued: Yes (comment) Precaution Comments: Reviewed no pillow under knee Restrictions Weight Bearing Restrictions: Yes LLE Weight Bearing: Weight bearing as tolerated      Mobility  Bed Mobility Overal bed mobility: +2 for physical assistance;Needs Assistance Bed Mobility: Supine to Sit     Supine to sit: Max assist;+2 for physical assistance;+2 for safety/equipment     General bed mobility comments: Assist provided for managing LLE and supporting trunk posteriorly.  Increased time and pt relies heavily on bed rails to pull up to sitting.  Transfers Overall transfer level: Needs assistance Equipment used: Rolling walker (2 wheeled) Transfers: Sit to/from Stand Sit to Stand: Mod assist;+2 physical assistance;+2 safety/equipment Stand pivot transfers: Mod assist;+2 physical assistance;+2 safety/equipment       General transfer comment: VC for hand  placement and assist to push to standing upright.    Ambulation/Gait Ambulation/Gait assistance: Min guard;+2 safety/equipment Ambulation Distance (Feet): 50 Feet Assistive device: Rolling walker (2 wheeled) Gait Pattern/deviations: Step-to pattern;Antalgic;Trunk flexed;Decreased weight shift to left;Decreased stride length;Decreased stance time - left   Gait velocity interpretation: Below normal speed for age/gender General Gait Details: Cues to stand upright and look ahead while ambulating.  Pt's BUEs become faitgued after 50 ft and he requests to sit.  Stairs            Wheelchair Mobility    Modified Rankin (Stroke Patients Only)       Balance Overall balance assessment: Needs assistance Sitting-balance support: Feet supported;Bilateral upper extremity supported Sitting balance-Leahy Scale: Fair Sitting balance - Comments: Min guard sitting EOB 2/2 pt's weight shift to R hip Postural control: Right lateral lean Standing balance support: Bilateral upper extremity supported;During functional activity Standing balance-Leahy Scale: Poor Standing balance comment: Relies heavily on RW for support                             Pertinent Vitals/Pain Pain Assessment: 0-10 Pain Score: 5  (7/10 at start of session, down to 5/10 at end) Pain Location: L knee Pain Descriptors / Indicators: Throbbing;Sore Pain Intervention(s): Limited activity within patient's tolerance;Monitored during session;Repositioned;Patient requesting pain meds-RN notified;RN gave pain meds during session    [place] expects to be discharged to:: Private residence Living Arrangements: Spouse/significant other (girlfriend) Available Help at Discharge: Friend(s);Available 24 hours/day (girlfriend who has "bad knees", limited physical assist) Type of Home: House Home Access: Stairs  to enter Entrance Stairs-Rails: Left Entrance Stairs-Number of Steps: 3 Home Layout: One  level Home Equipment: None      Prior Function Level of Independence: Independent               Hand Dominance        Extremity/Trunk Assessment   Upper Extremity Assessment: Defer to OT evaluation           Lower Extremity Assessment: LLE deficits/detail   LLE Deficits / Details: weakness and limited ROM s/p L TKA     Communication   Communication: No difficulties  Cognition Arousal/Alertness: Awake/alert Behavior During Therapy: WFL for tasks assessed/performed Overall Cognitive Status: Within Functional Limits for tasks assessed                      General Comments      Exercises Total Joint Exercises Ankle Circles/Pumps: AROM;Both;15 reps;Supine Quad Sets: AROM;Both;10 reps;Supine      Assessment/Plan    PT Assessment Patient needs continued PT services  PT Diagnosis Difficulty walking;Abnormality of gait;Generalized weakness;Acute pain   PT Problem List Decreased strength;Decreased range of motion;Decreased activity tolerance;Decreased balance;Decreased mobility;Decreased coordination;Decreased knowledge of use of DME;Decreased safety awareness;Decreased knowledge of precautions;Decreased skin integrity;Pain;Obesity  PT Treatment Interventions DME instruction;Gait training;Stair training;Functional mobility training;Therapeutic activities;Therapeutic exercise;Balance training;Neuromuscular re-education;Patient/family education;Modalities   PT Goals (Current goals can be found in the Care Plan section) Acute Rehab PT Goals Patient Stated Goal: to go home PT Goal Formulation: With patient Time For Goal Achievement: 07/03/15 Potential to Achieve Goals: Good    Frequency 7X/week   Barriers to discharge Inaccessible home environment;Decreased caregiver support 3 stairs to enter and limited physical assist at home    Co-evaluation   Reason for Co-Treatment: For patient/therapist safety   OT goals addressed during session: ADL's and  self-care       End of Session Equipment Utilized During Treatment: Gait belt Activity Tolerance: Patient limited by pain;Patient limited by fatigue Patient left: in chair;with call bell/phone within reach Nurse Communication: Mobility status;Precautions;Weight bearing status         Time: 0762-2633 PT Time Calculation (min) (ACUTE ONLY): 32 min   Charges:   PT Evaluation $Initial PT Evaluation Tier I: 1 Procedure     PT G Codes:       Joslyn Hy PT, DPT [phone] Pager: [phone] 06/26/2015, 1:22 PM

## 2015-06-26 NOTE — Anesthesia Postprocedure Evaluation (Signed)
  Anesthesia Post-op Note  Patient: Tyler Maynard  Procedure(s) Performed: Procedure(s) with comments: TOTAL KNEE ARTHROPLASTY (Left) - Left Total Knee Arthroplasty  Patient Location: PACU  Anesthesia Type: Spinal/MAC  Level of Consciousness: awake and alert   Airway and Oxygen Therapy: Patient Spontanous Breathing  Post-op Pain: Controlled  Post-op Assessment: Post-op Vital signs reviewed, Patient's Cardiovascular Status Stable and Respiratory Function Stable. Block receeding.  Post-op Vital Signs: Reviewed  Filed Vitals:   06/26/15 0215  BP: 142/89  Pulse: 98  Temp: 37.5 C  Resp: 19    Complications: No apparent anesthesia complications

## 2015-06-26 NOTE — Progress Notes (Signed)
OT Cancellation Note  Patient Details Name: Tyler Maynard MRN: [id number] DOB: Feb 02, 1951   Cancelled Treatment:    Reason Eval/Treat Not Completed: Pain limiting ability to participate Pt in 9/10 pain. Pt reports throbbing excruciating pain. RN aware Role of OT of explained and importance of working with therapy. Pt states he will when pain controlled  [place], Thereasa Parkin 06/26/2015, 12:03 PM

## 2015-06-26 NOTE — Progress Notes (Signed)
Physical Therapy Treatment Patient Details Name: Tyler Maynard MRN: [id number] DOB: [birth year] Today's Date: 06/26/2015    History of Present Illness Pt is a 64 y/o M s/p L TKA.  Pt's PMH includes HTN.    PT Comments    Pt is progressing well with therapy and ambulated 100 ft w/ RW w/ min guard assist this session.  Pt will benefit from continued skilled PT services to increase functional independence and safety.  Focus should be placed on gait mechanics and strengthening so pt can return home upon d/c.    Follow Up Recommendations  Home health PT;Supervision/Assistance - 24 hour     Equipment Recommendations  Rolling walker with 5" wheels    Recommendations for Other Services       Precautions / Restrictions Precautions Precautions: Fall;Knee Precaution Booklet Issued: Yes (comment) Precaution Comments: Reviewed no pillow under knee Restrictions Weight Bearing Restrictions: Yes LLE Weight Bearing: Weight bearing as tolerated    Mobility  Bed Mobility Overal bed mobility: Modified Independent Bed Mobility: Supine to Sit     Supine to sit: Modified independent (Device/Increase time)     General bed mobility comments: No physical assist required; however pt w/ increased time w/ HOB elevated and mod use of bed rails.  Cues for sequencing and technique.  Transfers Overall transfer level: Needs assistance Equipment used: Rolling walker (2 wheeled) Transfers: Sit to/from Stand Sit to Stand: Min assist;+2 physical assistance;From elevated surface Stand pivot transfers: Mod assist;+2 physical assistance;+2 safety/equipment       General transfer comment: Min +2 assist to power up to standing.  Cues for hand placement.    Ambulation/Gait Ambulation/Gait assistance: Min guard Ambulation Distance (Feet): 100 Feet Assistive device: Rolling walker (2 wheeled) Gait Pattern/deviations: Step-to pattern;Antalgic;Trunk flexed;Shuffle;Decreased weight shift to left;Decreased  stride length;Decreased stance time - left   Gait velocity interpretation: Below normal speed for age/gender General Gait Details: Cues to stand upright and look ahead while ambulating. R foot shuffling on floor but pt able to demonstrate proper foot clearance after VCs to not allow it to shuffle.  Heavy reliance on RW pushing through [place].  Pt slowly progressing to dec WB through [place] and begin placing more weight through BLEs.   Stairs            Wheelchair Mobility    Modified Rankin (Stroke Patients Only)       Balance Overall balance assessment: Needs assistance Sitting-balance support: No upper extremity supported;Feet supported Sitting balance-Leahy Scale: Fair Sitting balance - Comments: Min guard sitting EOB 2/2 pt's weight shift to R hip Postural control: Right lateral lean Standing balance support: Bilateral upper extremity supported;During functional activity Standing balance-Leahy Scale: Poor Standing balance comment: Relies heavily on RW for support                    Cognition Arousal/Alertness: Awake/alert Behavior During Therapy: WFL for tasks assessed/performed Overall Cognitive Status: Within Functional Limits for tasks assessed                      Exercises Total Joint Exercises Ankle Circles/Pumps: AROM;Both;15 reps;Supine Quad Sets: AROM;Both;10 reps;Supine Short Arc Quad: Left;5 reps;AAROM;Seated Knee Flexion: AROM;Left;5 reps;Seated Goniometric ROM: 90 deg L knee flexion    General Comments        Pertinent Vitals/Pain Pain Assessment: 0-10 Pain Score: 7  Pain Location: L knee Pain Descriptors / Indicators: Aching;Throbbing Pain Intervention(s): Limited activity within patient's tolerance;Monitored during session;Repositioned    Home Living  Family/patient expects to be discharged to:: Private residence Living Arrangements: Spouse/significant other (girlfriend) Available Help at Discharge: Friend(s);Available 24  hours/day (girlfriend who has "bad knees", limited physical assist) Type of Home: House Home Access: Stairs to enter Entrance Stairs-Rails: Left Home Layout: One level Home Equipment: None      Prior Function Level of Independence: Independent          PT Goals (current goals can now be found in the care plan section) Acute Rehab PT Goals Patient Stated Goal: to go home once stronger PT Goal Formulation: With patient Time For Goal Achievement: 07/03/15 Potential to Achieve Goals: Good Progress towards PT goals: Progressing toward goals    Frequency  7X/week    PT Plan Current plan remains appropriate    Co-evaluation   Reason for Co-Treatment: For patient/therapist safety   OT goals addressed during session: ADL's and self-care     End of Session Equipment Utilized During Treatment: Gait belt Activity Tolerance: Patient limited by fatigue Patient left: in chair;with call bell/phone within reach;with family/visitor present     Time: 9563-8756 PT Time Calculation (min) (ACUTE ONLY): 28 min  Charges:  $Gait Training: 8-22 mins $Therapeutic Exercise: 8-22 mins                    G Codes:      Joslyn Hy PT, [place] [phone] Pager: [phone] 06/26/2015, 4:03 PM

## 2015-06-26 NOTE — Care Management Utilization Note (Signed)
Utilization review completed. Tyara Dassow, RN Case Manager [phone]. 

## 2015-06-26 NOTE — Evaluation (Signed)
Occupational Therapy Evaluation Patient Details Name: Tyler Maynard MRN: [id number] DOB: [birth year] Today's Date: 06/26/2015    History of Present Illness TOTAL KNEE ARTHROPLASTY (Left)   Clinical Impression   Pt is s/p TKA resulting in the deficits listed below (see OT Problem List).  Pt will benefit from skilled OT to increase their safety and independence with ADL and functional mobility for ADL to facilitate discharge to venue listed below.        Follow Up Recommendations  Home health OT;Supervision/Assistance - 24 hour    Equipment Recommendations  3 in 1 bedside comode;Tub/shower bench;Other (comment) (will need wide 3 n 1)       Precautions / Restrictions Precautions Precautions: Knee Restrictions LLE Weight Bearing: Weight bearing as tolerated      Mobility Bed Mobility Overal bed mobility: +2 for physical assistance;Needs Assistance Bed Mobility: Supine to Sit     Supine to sit: Max assist;+2 for physical assistance;+2 for safety/equipment        Transfers Overall transfer level: Needs assistance Equipment used: Rolling walker (2 wheeled);2 person hand held assist Transfers: Sit to/from [facility] Sit to Stand: Mod assist;+2 physical assistance;+2 safety/equipment Stand pivot transfers: Mod assist;+2 physical assistance;+2 safety/equipment       General transfer comment: VC for hand and feet placement         ADL Overall ADL's : Needs assistance/impaired Eating/Feeding: Set up;Sitting   Grooming: Set up   Upper Body Bathing: Set up;Sitting   Lower Body Bathing: Maximal assistance;Sit to/from stand   Upper Body Dressing : Set up;Sitting   Lower Body Dressing: Maximal assistance;Sit to/from stand                 General ADL Comments: co treat with PT. Clinical reasoning for ADL levels.  possible needed DME explained               Pertinent Vitals/Pain Pain Assessment: 0-10 Pain Score: 5  Pain Descriptors /  Indicators: Sore;Throbbing Pain Intervention(s): Limited activity within patient's tolerance;Monitored during session;Premedicated before session;Repositioned     Hand Dominance     Extremity/Trunk Assessment Upper Extremity Assessment Upper Extremity Assessment: Overall WFL for tasks assessed           Communication Communication Communication: No difficulties   Cognition Arousal/Alertness: Awake/alert Behavior During Therapy: WFL for tasks assessed/performed Overall Cognitive Status: Within Functional Limits for tasks assessed                     General Comments   girlfriend has bad knees so pt wants to be mod I            Home Living Family/patient expects to be discharged to:: Private residence Living Arrangements: Spouse/significant other Available Help at Discharge: Family               Bathroom Shower/Tub: Teacher, early years/pre: Standard     Home Equipment: None               OT Diagnosis: Generalized weakness;Acute pain   OT Problem List: Decreased strength;Decreased knowledge of use of DME or AE;Pain   OT Treatment/Interventions: Self-care/ADL training;DME and/or AE instruction;Patient/family education    OT Goals(Current goals can be found in the care plan section) Acute Rehab OT Goals Patient Stated Goal: get home with spouse OT Goal Formulation: With patient Time For Goal Achievement: 07/03/15 Potential to Achieve Goals: Good ADL Goals Pt Will Perform Lower Body Dressing: with supervision;sit to/from  stand;with adaptive equipment Pt Will Transfer to Toilet: with supervision;ambulating;bedside commode;regular height toilet Pt Will Perform Toileting - Clothing Manipulation and hygiene: sit to/from stand Pt Will Perform Tub/Shower Transfer: with min guard assist;tub bench;rolling walker  OT Frequency: Min 2X/week   Barriers to D/C:            Co-evaluation PT/OT/SLP Co-Evaluation/Treatment: Yes Reason for  Co-Treatment: For patient/therapist safety   OT goals addressed during session: ADL's and self-care      End of Session Nurse Communication: Mobility status  Activity Tolerance: Patient tolerated treatment well Patient left: in chair;with call bell/phone within reach   Time: 1208-1235 OT Time Calculation (min): 27 min Charges:  OT General Charges $OT Visit: 1 Procedure OT Evaluation $Initial OT Evaluation Tier I: 1 Procedure G-Codes:    Betsy Pries 07-14-15, 12:47 PM

## 2015-06-27 LAB — CBC
HCT: 37.7 % — ABNORMAL LOW (ref 39.0–52.0)
Hemoglobin: 12.7 g/dL — ABNORMAL LOW (ref 13.0–17.0)
MCH: 28.2 pg (ref 26.0–34.0)
MCHC: 33.7 g/dL (ref 30.0–36.0)
MCV: 83.6 fL (ref 78.0–100.0)
Platelets: 234 10*3/uL (ref 150–400)
RBC: 4.51 MIL/uL (ref 4.22–5.81)
RDW: 14.7 % (ref 11.5–15.5)
WBC: 9.8 10*3/uL (ref 4.0–10.5)

## 2015-06-27 NOTE — Discharge Summary (Signed)
Patient ID: Tyler Maynard MRN: [id number] DOB/AGE: 1951-08-24 64 y.o.  Admit date: 06/25/2015 Discharge date: 06/27/2015  Admission Diagnoses:  Principal Problem:   Primary osteoarthritis of right knee Active Problems:   Arthritis of knee   Discharge Diagnoses:  Same  Past Medical History  Diagnosis Date  . Hypertension   . Sleep apnea     borderline-lost weight  . Arthritis     Surgeries: Procedure(s): TOTAL KNEE ARTHROPLASTY on 06/25/2015   Consultants:    Discharged Condition: Improved  Hospital Course: Tyler Maynard is an 64 y.o. male who was admitted 06/25/2015 for operative treatment ofPrimary osteoarthritis of right knee. Patient has severe unremitting pain that affects sleep, daily activities, and work/hobbies. After pre-op clearance the patient was taken to the operating room on 06/25/2015 and underwent  Procedure(s): TOTAL KNEE ARTHROPLASTY.    Patient was given perioperative antibiotics: Anti-infectives    Start     Dose/Rate Route Frequency Ordered Stop   06/25/15 1210  cefUROXime (ZINACEF) injection  Status:  Discontinued       As needed 06/25/15 1210 06/25/15 1347   06/25/15 1100  ceFAZolin (ANCEF) 3 g in dextrose 5 % 50 mL IVPB     3 g 160 mL/hr over 30 Minutes Intravenous To [facility] 06/24/15 0941 06/25/15 1150       Patient was given sequential compression devices, early ambulation, and chemoprophylaxis to prevent DVT.  Patient benefited maximally from hospital stay and there were no complications.    Recent vital signs: Patient Vitals for the past 24 hrs:  BP Temp Temp src Pulse Resp SpO2  06/27/15 0510 118/68 mmHg (!) 100.8 F (38.2 C) Oral 92 18 97 %  06/26/15 1900 123/69 mmHg 100 F (37.8 C) Oral 93 17 97 %  06/26/15 1241 128/77 mmHg 100.2 F (37.9 C) - 99 19 97 %     Recent laboratory studies:  Recent Labs  06/26/15 0442 06/27/15 0404  WBC 9.5 9.8  HGB 12.5* 12.7*  HCT 37.1* 37.7*  PLT 252 234  NA 134*  --   K 3.7  --    CL 98*  --   CO2 29  --   BUN 7  --   CREATININE 0.87  --   GLUCOSE 124*  --   CALCIUM 8.7*  --      Discharge Medications:     Medication List    STOP taking these medications        HYDROcodone-acetaminophen 10-325 MG per tablet  Commonly known as:  NORCO      TAKE these medications        aspirin EC 325 MG tablet  Take 1 tablet (325 mg total) by mouth 2 (two) times daily.     atorvastatin 10 MG tablet  Commonly known as:  LIPITOR  Take 10 mg by mouth at bedtime.     diclofenac sodium 1 % Gel  Commonly known as:  VOLTAREN  Apply 1 application topically 3 (three) times daily as needed (knee pain).     gemfibrozil 600 MG tablet  Commonly known as:  LOPID  Take 600 mg by mouth 2 (two) times daily.     lisinopril-hydrochlorothiazide 20-25 MG per tablet  Commonly known as:  PRINZIDE,ZESTORETIC  Take 1 tablet by mouth daily.     methocarbamol 500 MG tablet  Commonly known as:  ROBAXIN  Take 1 tablet (500 mg total) by mouth 2 (two) times daily with a meal.     montelukast 10 MG  tablet  Commonly known as:  SINGULAIR  Take 10 mg by mouth at bedtime as needed (seasonal allergies).     oxyCODONE-acetaminophen 5-325 MG per tablet  Commonly known as:  ROXICET  Take 1 tablet by mouth every 4 (four) hours as needed.     testosterone cypionate 200 MG/ML injection  Commonly known as:  DEPOTESTOSTERONE CYPIONATE  Inject 200 mg into the muscle every 28 (twenty-eight) days.     Vitamin D (Cholecalciferol) 1000 UNITS Caps  Take 1,000 Units by mouth daily.        Diagnostic Studies: Dg Chest 2 View  06/13/2015   CLINICAL DATA:  Preop for left knee replacement  EXAM: CHEST  2 VIEW  COMPARISON:  None.  FINDINGS: Cardiomediastinal silhouette is unremarkable. Mild thoracic dextroscoliosis. Degenerative changes thoracic spine. No acute infiltrate or pulmonary edema.  IMPRESSION: No active disease. Mild thoracic dextroscoliosis. Degenerative changes thoracic spine.    Electronically Signed   By: Lahoma Crocker M.D.   On: 06/13/2015 15:54    Disposition: Final discharge disposition not confirmed      Discharge Instructions    CPM    Complete by:  As directed   Continuous passive motion machine (CPM):      Use the CPM from 0 to 60  for 5 hours per day.      You may increase by 10 degrees per day.  You may break it up into 2 or 3 sessions per day.      Use CPM for 2 weeks or until you are told to stop.     Call MD / Call 911    Complete by:  As directed   If you experience chest pain or shortness of breath, CALL 911 and be transported to the hospital emergency room.  If you develope a fever above 101 F, pus (white drainage) or increased drainage or redness at the wound, or calf pain, call your surgeon's office.     Change dressing    Complete by:  As directed   Change dressing on day 5, then change the dressing daily with sterile 4 x 4 inch gauze dressing and apply TED hose.  You may clean the incision with alcohol prior to redressing.     Constipation Prevention    Complete by:  As directed   Drink plenty of fluids.  Prune juice may be helpful.  You may use a stool softener, such as Colace (over the counter) 100 mg twice a day.  Use MiraLax (over the counter) for constipation as needed.     Diet - low sodium heart healthy    Complete by:  As directed      Discharge instructions    Complete by:  As directed   Follow up in office with Dr. Mayer Camel in 2 weeks.     Driving restrictions    Complete by:  As directed   No driving for 2 weeks     Increase activity slowly as tolerated    Complete by:  As directed      Patient may shower    Complete by:  As directed   You may shower without a dressing once there is no drainage.  Do not wash over the wound.  If drainage remains, cover wound with plastic wrap and then shower.           Follow-up Information    Follow up with Kerin Salen, MD In 2 weeks.   Specialty:  Orthopedic Surgery  Contact  information:   [place] [address] [phone]        Signed: Hardin Negus, [place] Sorn R 06/27/2015, 7:19 AM

## 2015-06-27 NOTE — Progress Notes (Signed)
Physical Therapy Treatment Patient Details Name: Tyler Maynard MRN: [id number] DOB: Aug 24, 1951 Today's Date: 06/27/2015    History of Present Illness Pt is a 64 y/o M s/p L TKA.  Pt's PMH includes HTN.    PT Comments    Rapid response called 2/2 ~10 second event at end of session in which pt became unresponsive (see notes below).  Prior to event pt had become hot and nauseous.  Vitals WNL and pt resting comfortably A&Ox4 at end of session w/ girlfriend and RN present.  Prior to event pt demonstrated ability to ambulate 150 ft and ascend/descend 2 steps w/ min guard assist.  Pt will benefit from continued skilled PT services to increase functional independence and safety.   Follow Up Recommendations  Home health PT;Supervision/Assistance - 24 hour     Equipment Recommendations  Rolling walker with 5" wheels    Recommendations for Other Services       Precautions / Restrictions Precautions Precautions: Fall;Knee;Other (comment) Precaution Comments: Rapid Response team called at end of session (see general notes below) Restrictions Weight Bearing Restrictions: Yes LLE Weight Bearing: Weight bearing as tolerated    Mobility  Bed Mobility Overal bed mobility: Modified Independent Bed Mobility: Supine to Sit     Supine to sit: Modified independent (Device/Increase time)     General bed mobility comments: No physical assist required; however pt w/ increased time.  HOB flat and no use of bed rails.  Transfers Overall transfer level: Needs assistance Equipment used: Rolling walker (2 wheeled) Transfers: Sit to/from Stand Sit to Stand: From elevated surface;Min guard         General transfer comment: Min guard for safety.  Cues for hand placement as pt attempts to stand up w/ both hands on RW.  Controlled descent to recliner chair following VCs to do so.  Ambulation/Gait Ambulation/Gait assistance: Min guard;+2 safety/equipment Ambulation Distance (Feet): 150  Feet Assistive device: Rolling walker (2 wheeled) Gait Pattern/deviations: Step-to pattern;Step-through pattern;Antalgic;Trunk flexed;Decreased weight shift to left;Decreased stride length;Decreased stance time - left   Gait velocity interpretation: Below normal speed for age/gender General Gait Details: Demonstration and cues for heel strike and toe off during ambulation which pt was able to demonstrate which improved his forward trunk posture and reliance on RW.   Stairs Stairs: Yes Stairs assistance: Min guard Stair Management: Sideways;One rail Left;Step to pattern Number of Stairs: 2 General stair comments: Demonstration and VCs for technique.    Wheelchair Mobility    Modified Rankin (Stroke Patients Only)       Balance Overall balance assessment: Needs assistance Sitting-balance support: Bilateral upper extremity supported;Feet supported Sitting balance-Leahy Scale: Good Sitting balance - Comments: Min guard sitting EOB 2/2 pt's weight shift to R hip   Standing balance support: Bilateral upper extremity supported;During functional activity Standing balance-Leahy Scale: Poor Standing balance comment: Relies on RW for support                    Cognition Arousal/Alertness: Awake/alert Behavior During Therapy: WFL for tasks assessed/performed Overall Cognitive Status: Within Functional Limits for tasks assessed                      Exercises Total Joint Exercises Knee Flexion: AROM;Left;5 reps;Seated;10 reps;Standing Goniometric ROM: 91 deg L knee flexion General Exercises - Lower Extremity Mini-Sqauts: Strengthening;Both;10 reps;Standing    General Comments General comments (skin integrity, edema, etc.): At end of session pt sitting in recliner chair and began to c/o nausea and  being hot.  Pt became unresponse w/ blank stare for ~10 seconds at which time rapid response was called. After the 10 seconds pt became responsive againa and oriented and  reported no knowldege of the event that he just remembers getting hot and nauseous.  Pt's girlfriend in room during incident who expressed her concern at the time.  RR and RN entered room four minutes after event and RR took vital signs VSS 113/67, HR 90, 98 % on RA, RR 13, temp 98.  Pt comfortable sitting in recliner chair w/ girlfriend and RN present at end of session.  RN called Joanell Rising (PA-C) to notify him of the event.      Pertinent Vitals/Pain Pain Assessment: 0-10 Pain Score: 5  Pain Location: L knee Pain Descriptors / Indicators: Aching Pain Intervention(s): Monitored during session;Limited activity within patient's tolerance;Repositioned    Home Living                      Prior Function            PT Goals (current goals can now be found in the care plan section) Acute Rehab PT Goals Patient Stated Goal: to go home once stronger PT Goal Formulation: With patient Time For Goal Achievement: 07/03/15 Potential to Achieve Goals: Good Progress towards PT goals: Progressing toward goals    Frequency  7X/week    PT Plan Current plan remains appropriate    Co-evaluation             End of Session Equipment Utilized During Treatment: Gait belt Activity Tolerance: Patient limited by fatigue Patient left: in chair;with call bell/phone within reach;with family/visitor present;with nursing/sitter in room     Time: 0905-0949 PT Time Calculation (min) (ACUTE ONLY): 44 min  Charges:  $Gait Training: 23-37 mins $Therapeutic Exercise: 8-22 mins                    G Codes:      Joslyn Hy PT, DPT [phone] Pager: [phone] 06/27/2015, 11:19 AM

## 2015-06-27 NOTE — Progress Notes (Signed)
PATIENT ID: Tyler Maynard  MRN: [id number]  DOB/AGE:  November 09, 1951 / 64 y.o.  2 Days Post-Op Procedure(s) (LRB): TOTAL KNEE ARTHROPLASTY (Left)    PROGRESS NOTE Subjective: Patient is alert, oriented, no Nausea, no Vomiting, yes passing gas, no Bowel Movement. Taking PO sips with plenty of fluids. Denies SOB, Chest or Calf Pain. Using Incentive Spirometer, PAS in place. Ambulate WBAT with pt walking 100 ft yesterday with therapy, CPM 0-40. Patient reports pain as 4 on 0-10 scale  .    Objective: Vital signs in last 24 hours: Filed Vitals:   06/26/15 0215 06/26/15 1241 06/26/15 1900 06/27/15 0510  BP: 142/89 128/77 123/69 118/68  Pulse: 98 99 93 92  Temp: 99.5 F (37.5 C) 100.2 F (37.9 C) 100 F (37.8 C) 100.8 F (38.2 C)  TempSrc:   Oral Oral  Resp: 19 19 17 18   Height:      Weight:      SpO2: 99% 97% 97% 97%      Intake/Output from previous day: I/O last 3 completed shifts: In: 1855 [P.O.:480; I.V.:1375] Out: 3900 [Urine:3900]   Intake/Output this shift:     LABORATORY DATA:  Recent Labs  06/26/15 0442 06/27/15 0404  WBC 9.5 9.8  HGB 12.5* 12.7*  HCT 37.1* 37.7*  PLT 252 234  NA 134*  --   K 3.7  --   CL 98*  --   CO2 29  --   BUN 7  --   CREATININE 0.87  --   GLUCOSE 124*  --   CALCIUM 8.7*  --     Examination: Neurologically intact Neurovascular intact Sensation intact distally Intact pulses distally Dorsiflexion/Plantar flexion intact Incision: dressing C/D/I No cellulitis present Compartment soft}  Assessment:   2 Days Post-Op Procedure(s) (LRB): TOTAL KNEE ARTHROPLASTY (Left) ADDITIONAL DIAGNOSIS: Expected Acute Blood Loss Anemia, Hypertension  Plan: PT/OT WBAT, CPM 5/hrs day until ROM 0-90 degrees, then D/C CPM DVT Prophylaxis:  SCDx72hrs, ASA 325 mg BID x 2 weeks DISCHARGE PLAN: Home, once pt meets therapy goals. DISCHARGE NEEDS: HHPT, CPM, Walker and 3-in-1 comode seat     Tyler Maynard R 06/27/2015, 7:15 AM

## 2015-06-27 NOTE — Significant Event (Signed)
Rapid Response Event Note  Overview: Time Called: 0938 Arrival Time: 0942 Event Type: Other (Comment)  Initial Focused Assessment:  Called to evaluate patient who suddenly became unresponsive.  Upon my arrival to patients room, Therapist and family at bedside.  Patient was in chair when he suddenly had blank stare and was not responsive which lasted about 10 seconds.  Patient is now awake and alert, does not remember event however did feel nauseous and hot prior to event.  Patient is very diaphoretic and cool washcloth.    Interventions:  VSS 113/67, HR 90, 98 % on RA, RR 13, temp 98.  Patient states he feels much better except still hot, and thinks maybe he worked to hard with therapy.  RN in room.     Event Summary:  RN to call if assistance needed   at      at          [facility], Harlin Rain

## 2015-06-27 NOTE — Progress Notes (Signed)
Occupational Therapy Treatment Patient Details Name: Arlo Butt MRN: [id number] DOB: May 11, 1951 Today's Date: 06/27/2015    History of present illness Pt is a 64 y/o M s/p L TKA.  Pt's PMH includes HTN.   OT comments  Focus of session on use of AE for LB ADL, safety and instruction in set up and use of DME with pt and his girlfriend.  Follow Up Recommendations  Home health OT;Supervision/Assistance - 24 hour    Equipment Recommendations  3 in 1 bedside comode;Tub/shower bench;Other (comment) (needs extra wide BSC)    Recommendations for Other Services      Precautions / Restrictions Precautions Precautions: Fall;Knee Precaution Comments:  Restrictions Weight Bearing Restrictions: Yes LLE Weight Bearing: Weight bearing as tolerated       Mobility Bed Mobility Overal bed mobility: Modified Independent Bed Mobility: Supine to Sit     Supine to sit: Modified independent (Device/Increase time)     General bed mobility comments: No physical assist required; however pt w/ increased time.  HOB flat and no use of bed rails.  Transfers Overall transfer level: Needs assistance Equipment used: Rolling walker (2 wheeled) Transfers: Sit to/from Stand Sit to Stand: Supervision             Balance                    ADL Overall ADL's : Needs assistance/impaired     Grooming: Wash/dry hands;Standing;Supervision/safety       Lower Body Bathing: Minimal assistance;Sit to/from stand Lower Body Bathing Details (indicate cue type and reason): educated in availability of long handled bath sponge     Lower Body Dressing: Minimal assistance;Sit to/from stand Lower Body Dressing Details (indicate cue type and reason): educated in use of reacher, sock aid and long handled shoe horn Toilet Transfer: Supervision/safety;Ambulation;RW Toilet Transfer Details (indicate cue type and reason): stood to urinate Toileting- Water quality scientist and Hygiene:  Engineer, site Details (indicate cue type and reason): pt opting to practice with HHPT, educated in tub transfer bench Functional mobility during ADLs: Supervision/safety;Rolling walker General ADL Comments: Advised pt and girlfriend that AE may be purchased in gift shop.  Instructed in safe footwear.  Recommended pt use bag or pocket to keep phone close at all times. CM made aware that standard 3 in1 would not accommodate pt and would need to be switched.      Vision                     Perception     Praxis      Cognition   Behavior During Therapy: WFL for tasks assessed/performed Overall Cognitive Status: Within Functional Limits for tasks assessed                       Extremity/Trunk Assessment               Exercises    Shoulder Instructions       General Comments      Pertinent Vitals/ Pain       Pain Assessment: Faces Pain Score: 5  Faces Pain Scale: Hurts little more Pain Location: knee Pain Descriptors / Indicators: Aching Pain Intervention(s): Premedicated before session;Monitored during session  Home Living  Prior Functioning/Environment              Frequency Min 2X/week     Progress Toward Goals  OT Goals(current goals can now be found in the care plan section)  Progress towards OT goals: Progressing toward goals  Acute Rehab OT Goals Patient Stated Goal: to go home once stronger Time For Goal Achievement: 07/03/15 Potential to Achieve Goals: Good  Plan Discharge plan remains appropriate    Co-evaluation                 End of Session     Activity Tolerance Patient tolerated treatment well   Patient Left with call bell/phone within reach;in bed;with family/visitor present   Nurse Communication          Time: 1345-1410 OT Time Calculation (min): 25 min  Charges: OT General Charges $OT Visit: 1 Procedure OT  Treatments $Self Care/Home Management : 23-37 mins  Malka So 06/27/2015, 2:31 PM [phone]

## 2015-10-27 ENCOUNTER — Other Ambulatory Visit: Payer: Self-pay | Admitting: Orthopedic Surgery

## 2015-10-30 ENCOUNTER — Encounter (HOSPITAL_COMMUNITY): Payer: Self-pay

## 2015-10-30 ENCOUNTER — Encounter (HOSPITAL_COMMUNITY)
Admission: RE | Admit: 2015-10-30 | Discharge: 2015-10-30 | Disposition: A | Discharge status: Home / Self Care | Payer: 59 | Source: Ambulatory Visit | Attending: Orthopedic Surgery | Admitting: Orthopedic Surgery

## 2015-10-30 DIAGNOSIS — Z0183 Encounter for blood typing: Secondary | ICD-10-CM | POA: Insufficient documentation

## 2015-10-30 DIAGNOSIS — Z01812 Encounter for preprocedural laboratory examination: Secondary | ICD-10-CM | POA: Insufficient documentation

## 2015-10-30 DIAGNOSIS — M179 Osteoarthritis of knee, unspecified: Secondary | ICD-10-CM | POA: Diagnosis not present

## 2015-10-30 HISTORY — DX: Personal history of urinary calculi: Z87.442

## 2015-10-30 LAB — BASIC METABOLIC PANEL
ANION GAP: 10 (ref 5–15)
BUN: 18 mg/dL (ref 6–20)
CALCIUM: 9.7 mg/dL (ref 8.9–10.3)
CO2: 26 mmol/L (ref 22–32)
Chloride: 103 mmol/L (ref 101–111)
Creatinine, Ser: 1.1 mg/dL (ref 0.61–1.24)
GFR calc Af Amer: >60 mL/min (ref >60)
GFR calc non Af Amer: >60 mL/min (ref >60)
GLUCOSE: 99 mg/dL (ref 65–99)
Potassium: 3.7 mmol/L (ref 3.5–5.1)
Sodium: 139 mmol/L (ref 135–145)

## 2015-10-30 LAB — CBC WITH DIFFERENTIAL/PLATELET
BASOS PCT: 1 %
Basophils Absolute: 0.1 10*3/uL (ref 0.0–0.1)
EOS PCT: 2 %
Eosinophils Absolute: 0.1 10*3/uL (ref 0.0–0.7)
HEMATOCRIT: 41.3 % (ref 39.0–52.0)
Hemoglobin: 13.7 g/dL (ref 13.0–17.0)
LYMPHS PCT: 25 %
Lymphs Abs: 1.4 10*3/uL (ref 0.7–4.0)
MCH: 27.1 pg (ref 26.0–34.0)
MCHC: 33.2 g/dL (ref 30.0–36.0)
MCV: 81.6 fL (ref 78.0–100.0)
MONO ABS: 0.3 10*3/uL (ref 0.1–1.0)
MONOS PCT: 6 %
NEUTROS ABS: 3.8 10*3/uL (ref 1.7–7.7)
Neutrophils Relative %: 66 %
Platelets: 258 10*3/uL (ref 150–400)
RBC: 5.06 MIL/uL (ref 4.22–5.81)
RDW: 14.9 % (ref 11.5–15.5)
WBC: 5.7 10*3/uL (ref 4.0–10.5)

## 2015-10-30 LAB — URINALYSIS, ROUTINE W REFLEX MICROSCOPIC
BILIRUBIN URINE: NEGATIVE
Glucose, UA: NEGATIVE mg/dL
Hgb urine dipstick: NEGATIVE
KETONES UR: NEGATIVE mg/dL
LEUKOCYTES UA: NEGATIVE
NITRITE: NEGATIVE
PH: 5.5 (ref 5.0–8.0)
Protein, ur: NEGATIVE mg/dL
Specific Gravity, Urine: 1.017 (ref 1.005–1.030)

## 2015-10-30 LAB — TYPE AND SCREEN
ABO/RH(D): O POS
Antibody Screen: NEGATIVE

## 2015-10-30 LAB — APTT: aPTT: 32 seconds (ref 24–37)

## 2015-10-30 LAB — SURGICAL PCR SCREEN
MRSA, PCR: NEGATIVE
Staphylococcus aureus: NEGATIVE

## 2015-10-30 LAB — PROTIME-INR
INR: 1.08 (ref 0.00–1.49)
Prothrombin Time: 14.2 seconds (ref 11.6–15.2)

## 2015-10-30 NOTE — Progress Notes (Signed)
PCP is Osei Bonsu  Patient denied having any acute cardiac or pulmonary issues  Patient informed Nurse that he had a sleep study at [facility], that he stopped breathing in his sleep whenever he slept on his back. Since then patient informed Nurse that he has lost weight, props himself up with pillows at night, and does not have a CPAP machine.

## 2015-10-30 NOTE — Pre-Procedure Instructions (Signed)
Tyler Maynard  10/30/2015     Your procedure is scheduled on : Monday November 10, 2015 at 7:30 AM.  Report to [facility] Admitting at 5:30 AM.  Call this number if you have problems the morning of surgery: [phone]    Remember:  Do not eat food or drink liquids after midnight.  Take these medicines the morning of surgery with A SIP OF WATER : Hydrocodone if needed   Stop taking any vitamins, herbal medications, Diclofenac/Voltaren, Ibuprofen, Advil, Motrin, etc on Monday December 5th   Do not wear jewelry.  Do not wear lotions, powders, or colgone.    Men may shave face and neck.  Do not bring valuables to the hospital.  [facility] is not responsible for any belongings or valuables.  Contacts, dentures or bridgework may not be worn into surgery.  Leave your suitcase in the car.  After surgery it may be brought to your room.  For patients admitted to the hospital, discharge time will be determined by your treatment team.  Patients discharged the day of surgery will not be allowed to drive home.   Name and phone number of your driver:    Special instructions:  Shower using CHG soap the night before and the morning of your surgery  Please read over the following fact sheets that you were given. Pain Booklet, Coughing and Deep Breathing, Blood Transfusion Information, Total Joint Packet, MRSA Information and Surgical Site Infection Prevention

## 2015-11-07 NOTE — H&P (Signed)
TOTAL KNEE ADMISSION H&P  Patient is being admitted for right total knee arthroplasty.  Subjective:  Chief Complaint:right knee pain.  HPI: Tyler Maynard, 64 y.o. male, has a history of pain and functional disability in the right knee due to arthritis and has failed non-surgical conservative treatments for greater than 12 weeks to includeNSAID's and/or analgesics, flexibility and strengthening excercises, weight reduction as appropriate and activity modification.  Onset of symptoms was gradual, starting 1 years ago with gradually worsening course since that time. The patient noted no past surgery on the right knee(s).  Patient currently rates pain in the right knee(s) at 10 out of 10 with activity. Patient has night pain, worsening of pain with activity and weight bearing, pain that interferes with activities of daily living, pain with passive range of motion, crepitus and joint swelling.  Patient has evidence of joint space narrowing by imaging studies.  There is no active infection.  Patient Active Problem List   Diagnosis Date Noted  . Primary osteoarthritis of right knee 06/25/2015  . Arthritis of knee 06/25/2015   Past Medical History  Diagnosis Date  . Hypertension   . Sleep apnea     borderline-lost weight  . Arthritis   . History of kidney stones     Past Surgical History  Procedure Laterality Date  . Total knee arthroplasty Left 06/25/2015  . Total knee arthroplasty Left 06/25/2015    Procedure: TOTAL KNEE ARTHROPLASTY;  Surgeon: Frederik Pear, MD;  Location: [place];  Service: Orthopedics;  Laterality: Left;  Left Total Knee Arthroplasty    No prescriptions prior to admission   No Known Allergies  Social History  Substance Use Topics  . Smoking status: Former Smoker -- 1.00 packs/day for 10 years    Types: Cigarettes    Quit date: 06/13/1995  . Smokeless tobacco: Never Used  . Alcohol Use: Yes     Comment: social    No family history on file.   Review of Systems   Constitutional:       Weight changes  HENT: Negative.   Eyes: Negative.   Respiratory: Positive for shortness of breath.   Cardiovascular:       Htn  Gastrointestinal: Negative.   Genitourinary:       Ed  Musculoskeletal: Positive for joint pain.  Skin: Negative.   Neurological: Negative.   Endo/Heme/Allergies: Negative.   Psychiatric/Behavioral: Negative.     Objective:  Physical Exam  Constitutional: He is oriented to person, place, and time. He appears well-developed and well-nourished.  HENT:  Head: Normocephalic and atraumatic.  Eyes: Pupils are equal, round, and reactive to light.  Neck: Normal range of motion. Neck supple.  Cardiovascular: Intact distal pulses.   Respiratory: Effort normal.  Musculoskeletal: He exhibits tenderness.  Impressive varus deformity on the right knee bone-on-bone with crepitus as you taken through a range of motion from 10-120.    Neurological: He is alert and oriented to person, place, and time.  Skin: Skin is warm and dry.  Psychiatric: He has a normal mood and affect. His behavior is normal. Judgment and thought content normal.    Vital signs in last 24 hours:    Labs:   Estimated body mass index is 38.36 kg/(m^2) as calculated from the following:   Height as of 06/25/15: 6' (1.829 m).   Weight as of 06/13/15: 128.323 kg (282 lb 14.4 oz).   Imaging Review Plain radiographs demonstrate  bilateral 5-10 varus deformities bone-on-bone subluxation of tibias lateral to  the femurs, 5 mm.  The varus deformities.  She show some erosion of the tibias quite impressive.  There are some posterior superior osteophytes off the femur.  There also peripheral osteophytes on the patella with loss of cartilage height.  Assessment/Plan:  End stage arthritis, right knee   The patient history, physical examination, clinical judgment of the provider and imaging studies are consistent with end stage degenerative joint disease of the right knee(s) and  total knee arthroplasty is deemed medically necessary. The treatment options including medical management, injection therapy arthroscopy and arthroplasty were discussed at length. The risks and benefits of total knee arthroplasty were presented and reviewed. The risks due to aseptic loosening, infection, stiffness, patella tracking problems, thromboembolic complications and other imponderables were discussed. The patient acknowledged the explanation, agreed to proceed with the plan and consent was signed. Patient is being admitted for inpatient treatment for surgery, pain control, PT, OT, prophylactic antibiotics, VTE prophylaxis, progressive ambulation and ADL's and discharge planning. The patient is planning to be discharged home with home health services

## 2015-11-09 MED ORDER — CEFAZOLIN SODIUM 10 G IJ SOLR
3.0000 g | INTRAMUSCULAR | Status: AC
  Administered 2015-11-10: 3 g via INTRAVENOUS
  Filled 2015-11-09: qty 3000

## 2015-11-09 NOTE — Anesthesia Preprocedure Evaluation (Addendum)
Anesthesia Evaluation  Patient identified by MRN, date of birth, ID band Patient awake    Reviewed: Allergy & Precautions, H&P , NPO status , Patient's Chart, lab work & pertinent test results  Airway Mallampati: II  TM Distance: >3 FB Neck ROM: Full    Dental no notable dental hx. (+) Partial Upper, Dental Advisory Given   Pulmonary sleep apnea , former smoker,    Pulmonary exam normal breath sounds clear to auscultation       Cardiovascular hypertension, Pt. on medications  Rhythm:Regular Rate:Normal     Neuro/Psych negative neurological ROS  negative psych ROS   GI/Hepatic negative GI ROS, Neg liver ROS,   Endo/Other  Morbid obesity  Renal/GU negative Renal ROS  negative genitourinary   Musculoskeletal  (+) Arthritis , Osteoarthritis,    Abdominal   Peds  Hematology negative hematology ROS (+)   Anesthesia Other Findings   Reproductive/Obstetrics negative OB ROS                               Component Value Date/Time   WBC 5.7 10/30/2015 1556   RBC 5.06 10/30/2015 1556   HGB 13.7 10/30/2015 1556   HCT 41.3 10/30/2015 1556   PLT 258 10/30/2015 1556   LABPROT 14.2 10/30/2015 1556   INR 1.08 10/30/2015 1556   APTT 32 10/30/2015 1556      Component Value Date/Time   NA 139 10/30/2015 1556   K 3.7 10/30/2015 1556   CL 103 10/30/2015 1556   CO2 26 10/30/2015 1556   BUN 18 10/30/2015 1556   CREATININE 1.10 10/30/2015 1556   CALCIUM 9.7 10/30/2015 1556      Anesthesia Physical  Anesthesia Plan  ASA: III  Anesthesia Plan: General and Regional   Post-op Pain Management:    Induction: Intravenous  Airway Management Planned: Oral ETT  Additional Equipment:   Intra-op Plan:   Post-operative Plan: Extubation in OR  Informed Consent: I have reviewed the patients History and Physical, chart, labs and discussed the procedure including the risks, benefits and alternatives  for the proposed anesthesia with the patient or authorized representative who has indicated his/her understanding and acceptance.   Dental advisory given  Plan Discussed with: CRNA  Anesthesia Plan Comments:        Anesthesia Quick Evaluation

## 2015-11-10 ENCOUNTER — Inpatient Hospital Stay (HOSPITAL_COMMUNITY)
Admission: RE | Admit: 2015-11-10 | Discharge: 2015-11-11 | DRG: 470 | Disposition: A | Discharge status: Home Health | Payer: 59 | Source: Ambulatory Visit | Attending: Orthopedic Surgery | Admitting: Orthopedic Surgery

## 2015-11-10 ENCOUNTER — Inpatient Hospital Stay (HOSPITAL_COMMUNITY): Payer: 59 | Admitting: Anesthesiology

## 2015-11-10 ENCOUNTER — Encounter (HOSPITAL_COMMUNITY): Payer: Self-pay | Admitting: *Deleted

## 2015-11-10 ENCOUNTER — Encounter (HOSPITAL_COMMUNITY)
Admission: RE | Disposition: A | Discharge status: Home Health | Payer: Self-pay | Source: Ambulatory Visit | Attending: Orthopedic Surgery

## 2015-11-10 DIAGNOSIS — Z96652 Presence of left artificial knee joint: Secondary | ICD-10-CM | POA: Diagnosis present

## 2015-11-10 DIAGNOSIS — M1711 Unilateral primary osteoarthritis, right knee: Principal | ICD-10-CM | POA: Diagnosis present

## 2015-11-10 DIAGNOSIS — Z6838 Body mass index (BMI) 38.0-38.9, adult: Secondary | ICD-10-CM | POA: Diagnosis not present

## 2015-11-10 DIAGNOSIS — I10 Essential (primary) hypertension: Secondary | ICD-10-CM | POA: Diagnosis present

## 2015-11-10 DIAGNOSIS — M25561 Pain in right knee: Secondary | ICD-10-CM | POA: Diagnosis present

## 2015-11-10 DIAGNOSIS — Z87891 Personal history of nicotine dependence: Secondary | ICD-10-CM

## 2015-11-10 DIAGNOSIS — E669 Obesity, unspecified: Secondary | ICD-10-CM | POA: Diagnosis present

## 2015-11-10 DIAGNOSIS — M1710 Unilateral primary osteoarthritis, unspecified knee: Secondary | ICD-10-CM | POA: Diagnosis present

## 2015-11-10 HISTORY — PX: TOTAL KNEE ARTHROPLASTY: SHX125

## 2015-11-10 SURGERY — ARTHROPLASTY, KNEE, TOTAL
Anesthesia: Regional | Site: Knee | Laterality: Right

## 2015-11-10 MED ORDER — ACETAMINOPHEN 325 MG PO TABS: 650.0000 mg | ORAL_TABLET | Freq: Four times a day (QID) | ORAL | Status: DC | PRN

## 2015-11-10 MED ORDER — ROCURONIUM BROMIDE 50 MG/5ML IV SOLN
INTRAVENOUS | Status: AC
  Filled 2015-11-10: qty 1

## 2015-11-10 MED ORDER — HYDROCHLOROTHIAZIDE 25 MG PO TABS
25.0000 mg | ORAL_TABLET | Freq: Every day | ORAL | Status: DC
  Administered 2015-11-10 – 2015-11-11 (×2): 25 mg via ORAL
  Filled 2015-11-10 (×2): qty 1

## 2015-11-10 MED ORDER — OXYCODONE HCL 5 MG PO TABS
5.0000 mg | ORAL_TABLET | ORAL | Status: DC | PRN
  Administered 2015-11-10 – 2015-11-11 (×4): 10 mg via ORAL
  Filled 2015-11-10 (×3): qty 2

## 2015-11-10 MED ORDER — HYDROMORPHONE HCL 1 MG/ML IJ SOLN
INTRAMUSCULAR | Status: AC
  Administered 2015-11-10: 0.5 mg via INTRAVENOUS
  Filled 2015-11-10: qty 1

## 2015-11-10 MED ORDER — CHLORHEXIDINE GLUCONATE 4 % EX LIQD: 60.0000 mL | Freq: Once | CUTANEOUS | Status: DC

## 2015-11-10 MED ORDER — ASPIRIN EC 325 MG PO TBEC: 325.0000 mg | DELAYED_RELEASE_TABLET | Freq: Two times a day (BID) | ORAL | Status: DC

## 2015-11-10 MED ORDER — ONDANSETRON HCL 4 MG PO TABS: 4.0000 mg | ORAL_TABLET | Freq: Four times a day (QID) | ORAL | Status: DC | PRN

## 2015-11-10 MED ORDER — HYDROMORPHONE HCL 1 MG/ML IJ SOLN
0.2500 mg | INTRAMUSCULAR | Status: DC | PRN
  Administered 2015-11-10 (×4): 0.5 mg via INTRAVENOUS

## 2015-11-10 MED ORDER — SUGAMMADEX SODIUM 200 MG/2ML IV SOLN
INTRAVENOUS | Status: DC | PRN
  Administered 2015-11-10: 500 mg via INTRAVENOUS

## 2015-11-10 MED ORDER — ATORVASTATIN CALCIUM 10 MG PO TABS
10.0000 mg | ORAL_TABLET | Freq: Every day | ORAL | Status: DC
  Administered 2015-11-10: 10 mg via ORAL
  Filled 2015-11-10: qty 1

## 2015-11-10 MED ORDER — FLEET ENEMA 7-19 GM/118ML RE ENEM: 1.0000 | ENEMA | Freq: Once | RECTAL | Status: DC | PRN

## 2015-11-10 MED ORDER — FENTANYL CITRATE (PF) 250 MCG/5ML IJ SOLN
INTRAMUSCULAR | Status: AC
  Filled 2015-11-10: qty 5

## 2015-11-10 MED ORDER — METHOCARBAMOL 1000 MG/10ML IJ SOLN
500.0000 mg | Freq: Four times a day (QID) | INTRAVENOUS | Status: DC | PRN
  Filled 2015-11-10: qty 5

## 2015-11-10 MED ORDER — DEXAMETHASONE SODIUM PHOSPHATE 4 MG/ML IJ SOLN
INTRAMUSCULAR | Status: DC | PRN
  Administered 2015-11-10: 4 mg via INTRAVENOUS

## 2015-11-10 MED ORDER — TRANEXAMIC ACID 1000 MG/10ML IV SOLN
1000.0000 mg | INTRAVENOUS | Status: AC
  Administered 2015-11-10: 1000 mg via INTRAVENOUS
  Filled 2015-11-10: qty 10

## 2015-11-10 MED ORDER — ROCURONIUM BROMIDE 100 MG/10ML IV SOLN
INTRAVENOUS | Status: DC | PRN
  Administered 2015-11-10: 50 mg via INTRAVENOUS

## 2015-11-10 MED ORDER — FENTANYL CITRATE (PF) 100 MCG/2ML IJ SOLN
INTRAMUSCULAR | Status: AC
  Filled 2015-11-10: qty 2

## 2015-11-10 MED ORDER — PHENYLEPHRINE HCL 10 MG/ML IJ SOLN
INTRAMUSCULAR | Status: DC | PRN
  Administered 2015-11-10: 40 ug via INTRAVENOUS
  Administered 2015-11-10 (×2): 80 ug via INTRAVENOUS

## 2015-11-10 MED ORDER — HYDROMORPHONE HCL 1 MG/ML IJ SOLN
1.0000 mg | INTRAMUSCULAR | Status: DC | PRN
  Administered 2015-11-10 – 2015-11-11 (×4): 1 mg via INTRAVENOUS
  Filled 2015-11-10 (×4): qty 1

## 2015-11-10 MED ORDER — GEMFIBROZIL 600 MG PO TABS
600.0000 mg | ORAL_TABLET | Freq: Two times a day (BID) | ORAL | Status: DC
  Administered 2015-11-10 – 2015-11-11 (×3): 600 mg via ORAL
  Filled 2015-11-10 (×5): qty 1

## 2015-11-10 MED ORDER — 0.9 % SODIUM CHLORIDE (POUR BTL) OPTIME
TOPICAL | Status: DC | PRN
  Administered 2015-11-10: 1000 mL

## 2015-11-10 MED ORDER — LISINOPRIL-HYDROCHLOROTHIAZIDE 20-25 MG PO TABS: 1.0000 | ORAL_TABLET | Freq: Every day | ORAL | Status: DC

## 2015-11-10 MED ORDER — BUPIVACAINE LIPOSOME 1.3 % IJ SUSP
20.0000 mL | INTRAMUSCULAR | Status: AC
  Administered 2015-11-10: 20 mL
  Filled 2015-11-10: qty 20

## 2015-11-10 MED ORDER — KCL IN DEXTROSE-NACL 20-5-0.45 MEQ/L-%-% IV SOLN
INTRAVENOUS | Status: DC
  Administered 2015-11-10 – 2015-11-11 (×2): via INTRAVENOUS
  Filled 2015-11-10 (×2): qty 1000

## 2015-11-10 MED ORDER — LACTATED RINGERS IV SOLN
INTRAVENOUS | Status: DC | PRN
  Administered 2015-11-10 (×2): via INTRAVENOUS

## 2015-11-10 MED ORDER — PROPOFOL 10 MG/ML IV BOLUS
INTRAVENOUS | Status: DC | PRN
  Administered 2015-11-10: 170 mg via INTRAVENOUS

## 2015-11-10 MED ORDER — ACETAMINOPHEN 650 MG RE SUPP: 650.0000 mg | Freq: Four times a day (QID) | RECTAL | Status: DC | PRN

## 2015-11-10 MED ORDER — PROMETHAZINE HCL 25 MG/ML IJ SOLN: 6.2500 mg | INTRAMUSCULAR | Status: DC | PRN

## 2015-11-10 MED ORDER — MENTHOL 3 MG MT LOZG: 1.0000 | LOZENGE | OROMUCOSAL | Status: DC | PRN

## 2015-11-10 MED ORDER — METHOCARBAMOL 500 MG PO TABS
500.0000 mg | ORAL_TABLET | Freq: Four times a day (QID) | ORAL | Status: DC | PRN
  Administered 2015-11-10 – 2015-11-11 (×3): 500 mg via ORAL
  Filled 2015-11-10 (×2): qty 1

## 2015-11-10 MED ORDER — HYDROCODONE-ACETAMINOPHEN 7.5-325 MG PO TABS: 1.0000 | ORAL_TABLET | Freq: Once | ORAL | Status: DC | PRN

## 2015-11-10 MED ORDER — ALUM & MAG HYDROXIDE-SIMETH 200-200-20 MG/5ML PO SUSP: 30.0000 mL | ORAL | Status: DC | PRN

## 2015-11-10 MED ORDER — LISINOPRIL 20 MG PO TABS
20.0000 mg | ORAL_TABLET | Freq: Every day | ORAL | Status: DC
  Administered 2015-11-10 – 2015-11-11 (×2): 20 mg via ORAL
  Filled 2015-11-10 (×2): qty 1

## 2015-11-10 MED ORDER — MONTELUKAST SODIUM 10 MG PO TABS: 10.0000 mg | ORAL_TABLET | Freq: Every evening | ORAL | Status: DC | PRN

## 2015-11-10 MED ORDER — PHENYLEPHRINE 40 MCG/ML (10ML) SYRINGE FOR IV PUSH (FOR BLOOD PRESSURE SUPPORT)
PREFILLED_SYRINGE | INTRAVENOUS | Status: AC
  Filled 2015-11-10: qty 10

## 2015-11-10 MED ORDER — METOCLOPRAMIDE HCL 5 MG PO TABS: 5.0000 mg | ORAL_TABLET | Freq: Three times a day (TID) | ORAL | Status: DC | PRN

## 2015-11-10 MED ORDER — SODIUM CHLORIDE 0.9 % IJ SOLN
INTRAMUSCULAR | Status: DC | PRN
  Administered 2015-11-10: 20 mL via INTRAVENOUS

## 2015-11-10 MED ORDER — METHOCARBAMOL 500 MG PO TABS: 500.0000 mg | ORAL_TABLET | Freq: Two times a day (BID) | ORAL | Status: DC

## 2015-11-10 MED ORDER — TRANEXAMIC ACID 1000 MG/10ML IV SOLN
2000.0000 mg | INTRAVENOUS | Status: AC
  Administered 2015-11-10: 2000 mg via TOPICAL
  Filled 2015-11-10: qty 20

## 2015-11-10 MED ORDER — MIDAZOLAM HCL 2 MG/2ML IJ SOLN
INTRAMUSCULAR | Status: AC
  Filled 2015-11-10: qty 2

## 2015-11-10 MED ORDER — DEXTROSE-NACL 5-0.45 % IV SOLN: INTRAVENOUS | Status: DC

## 2015-11-10 MED ORDER — METHOCARBAMOL 500 MG PO TABS
ORAL_TABLET | ORAL | Status: AC
  Filled 2015-11-10: qty 1

## 2015-11-10 MED ORDER — BUPIVACAINE-EPINEPHRINE 0.5% -1:200000 IJ SOLN
INTRAMUSCULAR | Status: DC | PRN
  Administered 2015-11-10: 20 mL

## 2015-11-10 MED ORDER — BISACODYL 5 MG PO TBEC: 5.0000 mg | DELAYED_RELEASE_TABLET | Freq: Every day | ORAL | Status: DC | PRN

## 2015-11-10 MED ORDER — TRANEXAMIC ACID 1000 MG/10ML IV SOLN
1000.0000 mg | INTRAVENOUS | Status: DC
  Filled 2015-11-10: qty 10

## 2015-11-10 MED ORDER — LIDOCAINE HCL (CARDIAC) 20 MG/ML IV SOLN
INTRAVENOUS | Status: DC | PRN
  Administered 2015-11-10: 20 mg via INTRAVENOUS

## 2015-11-10 MED ORDER — PHENOL 1.4 % MT LIQD: 1.0000 | OROMUCOSAL | Status: DC | PRN

## 2015-11-10 MED ORDER — DIPHENHYDRAMINE HCL 12.5 MG/5ML PO ELIX: 12.5000 mg | ORAL_SOLUTION | ORAL | Status: DC | PRN

## 2015-11-10 MED ORDER — ONDANSETRON HCL 4 MG/2ML IJ SOLN
4.0000 mg | Freq: Four times a day (QID) | INTRAMUSCULAR | Status: DC | PRN
  Administered 2015-11-10 – 2015-11-11 (×2): 4 mg via INTRAVENOUS
  Filled 2015-11-10 (×2): qty 2

## 2015-11-10 MED ORDER — EPHEDRINE SULFATE 50 MG/ML IJ SOLN
INTRAMUSCULAR | Status: AC
  Filled 2015-11-10: qty 1

## 2015-11-10 MED ORDER — OXYCODONE HCL 5 MG PO TABS
ORAL_TABLET | ORAL | Status: AC
  Filled 2015-11-10: qty 2

## 2015-11-10 MED ORDER — HYDROMORPHONE HCL 4 MG PO TABS: 4.0000 mg | ORAL_TABLET | ORAL | Status: DC | PRN

## 2015-11-10 MED ORDER — METOCLOPRAMIDE HCL 5 MG/ML IJ SOLN: 5.0000 mg | Freq: Three times a day (TID) | INTRAMUSCULAR | Status: DC | PRN

## 2015-11-10 MED ORDER — SUGAMMADEX SODIUM 500 MG/5ML IV SOLN
INTRAVENOUS | Status: AC
  Filled 2015-11-10: qty 5

## 2015-11-10 MED ORDER — SENNOSIDES-DOCUSATE SODIUM 8.6-50 MG PO TABS: 1.0000 | ORAL_TABLET | Freq: Every evening | ORAL | Status: DC | PRN

## 2015-11-10 MED ORDER — SODIUM CHLORIDE 0.9 % IR SOLN
Status: DC | PRN
  Administered 2015-11-10: 3000 mL

## 2015-11-10 MED ORDER — PROPOFOL 10 MG/ML IV BOLUS
INTRAVENOUS | Status: AC
  Filled 2015-11-10: qty 20

## 2015-11-10 MED ORDER — BUPIVACAINE LIPOSOME 1.3 % IJ SUSP
20.0000 mL | Freq: Once | INTRAMUSCULAR | Status: DC
  Filled 2015-11-10: qty 20

## 2015-11-10 MED ORDER — LIDOCAINE HCL (CARDIAC) 20 MG/ML IV SOLN
INTRAVENOUS | Status: AC
  Filled 2015-11-10: qty 10

## 2015-11-10 MED ORDER — ROPIVACAINE HCL 5 MG/ML IJ SOLN
INTRAMUSCULAR | Status: DC | PRN
  Administered 2015-11-10: 25 mL via PERINEURAL

## 2015-11-10 MED ORDER — STERILE WATER FOR INJECTION IJ SOLN
INTRAMUSCULAR | Status: AC
  Filled 2015-11-10: qty 10

## 2015-11-10 MED ORDER — ASPIRIN EC 325 MG PO TBEC
325.0000 mg | DELAYED_RELEASE_TABLET | Freq: Every day | ORAL | Status: DC
  Administered 2015-11-11: 325 mg via ORAL
  Filled 2015-11-10: qty 1

## 2015-11-10 MED ORDER — FENTANYL CITRATE (PF) 250 MCG/5ML IJ SOLN
INTRAMUSCULAR | Status: DC | PRN
  Administered 2015-11-10: 50 ug via INTRAVENOUS
  Administered 2015-11-10: 100 ug via INTRAVENOUS
  Administered 2015-11-10: 150 ug via INTRAVENOUS
  Administered 2015-11-10 (×2): 50 ug via INTRAVENOUS

## 2015-11-10 MED ORDER — ONDANSETRON HCL 4 MG/2ML IJ SOLN
INTRAMUSCULAR | Status: DC | PRN
  Administered 2015-11-10: 4 mg via INTRAVENOUS

## 2015-11-10 MED ORDER — DOCUSATE SODIUM 100 MG PO CAPS
100.0000 mg | ORAL_CAPSULE | Freq: Two times a day (BID) | ORAL | Status: DC
  Administered 2015-11-10 – 2015-11-11 (×2): 100 mg via ORAL
  Filled 2015-11-10 (×2): qty 1

## 2015-11-10 MED ORDER — BUPIVACAINE-EPINEPHRINE (PF) 0.5% -1:200000 IJ SOLN
INTRAMUSCULAR | Status: AC
  Filled 2015-11-10: qty 30

## 2015-11-10 MED ORDER — SUCCINYLCHOLINE CHLORIDE 20 MG/ML IJ SOLN
INTRAMUSCULAR | Status: AC
  Filled 2015-11-10: qty 1

## 2015-11-10 SURGICAL SUPPLY — 59 items
BAG DECANTER FOR FLEXI CONT (MISCELLANEOUS) ×3 IMPLANT
BANDAGE ESMARK 6X9 LF (GAUZE/BANDAGES/DRESSINGS) ×1 IMPLANT
BLADE SAG 18X100X1.27 (BLADE) ×3 IMPLANT
BLADE SAW SGTL 13X75X1.27 (BLADE) ×3 IMPLANT
BLADE SURG ROTATE 9660 (MISCELLANEOUS) IMPLANT
BNDG ELASTIC 6X10 VLCR STRL LF (GAUZE/BANDAGES/DRESSINGS) ×3 IMPLANT
BNDG ESMARK 6X9 LF (GAUZE/BANDAGES/DRESSINGS) ×3
BOWL SMART MIX CTS (DISPOSABLE) ×3 IMPLANT
CAPT KNEE TOTAL 3 ATTUNE ×3 IMPLANT
CEMENT HV SMART SET (Cement) ×6 IMPLANT
COVER SURGICAL LIGHT HANDLE (MISCELLANEOUS) ×3 IMPLANT
CUFF TOURNIQUET SINGLE 34IN LL (TOURNIQUET CUFF) ×3 IMPLANT
CUFF TOURNIQUET SINGLE 44IN (TOURNIQUET CUFF) IMPLANT
DRAPE EXTREMITY T 121X128X90 (DRAPE) ×3 IMPLANT
DRAPE U-SHAPE 47X51 STRL (DRAPES) ×3 IMPLANT
DURAPREP 26ML APPLICATOR (WOUND CARE) ×3 IMPLANT
ELECT CAUTERY BLADE 6.4 (BLADE) ×3 IMPLANT
ELECT REM PT RETURN 9FT ADLT (ELECTROSURGICAL) ×3
ELECTRODE REM PT RTRN 9FT ADLT (ELECTROSURGICAL) ×1 IMPLANT
EVACUATOR 1/8 PVC DRAIN (DRAIN) IMPLANT
GAUZE SPONGE 4X4 12PLY STRL (GAUZE/BANDAGES/DRESSINGS) ×3 IMPLANT
GAUZE XEROFORM 1X8 LF (GAUZE/BANDAGES/DRESSINGS) ×3 IMPLANT
GLOVE BIO SURGEON STRL SZ7.5 (GLOVE) ×3 IMPLANT
GLOVE BIO SURGEON STRL SZ8.5 (GLOVE) ×3 IMPLANT
GLOVE BIOGEL PI IND STRL 8 (GLOVE) ×1 IMPLANT
GLOVE BIOGEL PI IND STRL 9 (GLOVE) ×1 IMPLANT
GLOVE BIOGEL PI INDICATOR 8 (GLOVE) ×2
GLOVE BIOGEL PI INDICATOR 9 (GLOVE) ×2
GOWN STRL REUS W/ TWL LRG LVL3 (GOWN DISPOSABLE) ×1 IMPLANT
GOWN STRL REUS W/ TWL XL LVL3 (GOWN DISPOSABLE) ×2 IMPLANT
GOWN STRL REUS W/TWL LRG LVL3 (GOWN DISPOSABLE) ×2
GOWN STRL REUS W/TWL XL LVL3 (GOWN DISPOSABLE) ×4
HANDPIECE INTERPULSE COAX TIP (DISPOSABLE) ×2
HOOD PEEL AWAY FACE SHEILD DIS (HOOD) ×6 IMPLANT
KIT BASIN OR (CUSTOM PROCEDURE TRAY) ×3 IMPLANT
KIT ROOM TURNOVER OR (KITS) ×3 IMPLANT
MANIFOLD NEPTUNE II (INSTRUMENTS) ×3 IMPLANT
NEEDLE SPNL 18GX3.5 QUINCKE PK (NEEDLE) ×3 IMPLANT
NS IRRIG 1000ML POUR BTL (IV SOLUTION) ×3 IMPLANT
PACK TOTAL JOINT (CUSTOM PROCEDURE TRAY) ×3 IMPLANT
PACK UNIVERSAL I (CUSTOM PROCEDURE TRAY) ×3 IMPLANT
PAD ARMBOARD 7.5X6 YLW CONV (MISCELLANEOUS) ×6 IMPLANT
PADDING CAST COTTON 6X4 STRL (CAST SUPPLIES) ×3 IMPLANT
SET HNDPC FAN SPRY TIP SCT (DISPOSABLE) ×1 IMPLANT
SPONGE LAP 18X18 X RAY DECT (DISPOSABLE) ×3 IMPLANT
SUT VIC AB 0 CT1 27 (SUTURE) ×2
SUT VIC AB 0 CT1 27XBRD ANBCTR (SUTURE) ×1 IMPLANT
SUT VIC AB 1 CTX 36 (SUTURE) ×4
SUT VIC AB 1 CTX36XBRD ANBCTR (SUTURE) ×2 IMPLANT
SUT VIC AB 2-0 CT1 27 (SUTURE) ×2
SUT VIC AB 2-0 CT1 TAPERPNT 27 (SUTURE) ×1 IMPLANT
SUT VIC AB 3-0 CT1 27 (SUTURE) ×2
SUT VIC AB 3-0 CT1 TAPERPNT 27 (SUTURE) ×1 IMPLANT
SUT VIC AB 3-0 FS2 27 (SUTURE) ×3 IMPLANT
SYR 50ML LL SCALE MARK (SYRINGE) ×3 IMPLANT
TOWEL OR 17X24 6PK STRL BLUE (TOWEL DISPOSABLE) IMPLANT
TOWEL OR 17X26 10 PK STRL BLUE (TOWEL DISPOSABLE) ×6 IMPLANT
TRAY CATH 16FR W/PLASTIC CATH (SET/KITS/TRAYS/PACK) IMPLANT
WATER STERILE IRR 1000ML POUR (IV SOLUTION) IMPLANT

## 2015-11-10 NOTE — Anesthesia Procedure Notes (Addendum)
Anesthesia Regional Block:  Adductor canal block  Pre-Anesthetic Checklist: ,, timeout performed, Correct Patient, Correct Site, Correct Laterality, Correct Procedure, Correct Position, site marked, Risks and benefits discussed,  Surgical consent,  Pre-op evaluation,  At surgeon's request and post-op pain management  Laterality: Right  Prep: chloraprep       Needles:  Injection technique: Single-shot  Needle Type: Echogenic Needle     Needle Length: 9cm 9 cm Needle Gauge: 21 and 21 G    Additional Needles:  Procedures: ultrasound guided (picture in chart) Adductor canal block Narrative:  Start time: 11/10/2015 7:00 AM End time: 11/10/2015 7:10 AM Injection made incrementally with aspirations every 5 mL.  Performed by: Personally  Anesthesiologist: Suzette Battiest   Procedure Name: Intubation Date/Time: 11/10/2015 7:41 AM Performed by: Ollen Bowl Pre-anesthesia Checklist: Patient identified, Emergency Drugs available, Suction available, Patient being monitored and Timeout performed Patient Re-evaluated:Patient Re-evaluated prior to inductionOxygen Delivery Method: Circle system utilized and Simple face mask Preoxygenation: Pre-oxygenation with 100% oxygen Intubation Type: IV induction Ventilation: Mask ventilation without difficulty and Oral airway inserted - appropriate to patient size Laryngoscope Size: Mac and 4 Grade View: Grade I Tube type: Oral Tube size: 7.5 mm Number of attempts: 1 Airway Equipment and Method: Patient positioned with wedge pillow and Stylet Placement Confirmation: ETT inserted through vocal cords under direct vision,  positive ETCO2 and breath sounds checked- equal and bilateral Secured at: 23 cm Tube secured with: Tape Dental Injury: Teeth and Oropharynx as per pre-operative assessment

## 2015-11-10 NOTE — Progress Notes (Signed)
Utilization review completed.  

## 2015-11-10 NOTE — Transfer of Care (Signed)
Immediate Anesthesia Transfer of Care Note  Patient: Tyler Maynard  Procedure(s) Performed: Procedure(s): TOTAL KNEE ARTHROPLASTY (Right)  Patient Location: PACU  Anesthesia Type:GA combined with regional for post-op pain  Level of Consciousness: awake, alert  and oriented  Airway & Oxygen Therapy: Patient Spontanous Breathing and Patient connected to nasal cannula oxygen  Post-op Assessment: Report given to RN, Post -op Vital signs reviewed and stable and Patient moving all extremities X 4  Post vital signs: Reviewed and stable  Last Vitals:  Filed Vitals:   11/10/15 0602  BP: 141/93  Pulse: 73  Temp: 37 C  Resp: 20    Complications: No apparent anesthesia complications

## 2015-11-10 NOTE — Evaluation (Signed)
Physical Therapy Evaluation Patient Details Name: Tyler Maynard MRN: [id number] DOB: 11-26-1951 Today's Date: 11/10/2015   History of Present Illness  Pt is a 64 yo admitted for R TKA. PMHx: L TKA 05/2015, HTN, sleep apnea  Clinical Impression  Pt appeared sleepy and groggy throughout tx today. SpO2 was checked at end of tx and was stable at 95% on room air. Pt presents with R LE weakness, decreased ROM, and gait deficits that impair his ability for functional mobility. He demonstrated moderate quad insufficiency but did not exhibit knee buckling upon standing. Pt limited ambulation secondary to fatigue and nausea. Education was provided on HEP, use of bone foam/CPM, transferring with staff, and positioning. Pt would benefit from acute PT to improve R LE strength/ROM and increase functional mobility to decrease burden of care. Once medically cleared, D/C to home with HHPT would be appropriate. Pt and significant other were agreeable to POC.    Follow Up Recommendations Home health PT    Equipment Recommendations  None recommended by PT    Recommendations for Other Services OT consult     Precautions / Restrictions Precautions Precautions: Knee Restrictions Weight Bearing Restrictions: No      Mobility  Bed Mobility Overal bed mobility: Modified Independent             General bed mobility comments: Increased time. Briefly used hand rails to pivot.   Transfers Overall transfer level: Needs assistance   Transfers: Sit to/from Stand Sit to Stand: Min guard         General transfer comment: Two trials, pt attempted initially and sat down. Second trial was successful without physical assist, pt preferred to have one hand on RW. Min guard for stability/safety. VC for hand placement and sequencing. Increased time.   Ambulation/Gait Ambulation/Gait assistance: Min guard Ambulation Distance (Feet): 12 Feet Assistive device: Rolling walker (2 wheeled) Gait  Pattern/deviations: Step-to pattern;Decreased stance time - right   Gait velocity interpretation: Below normal speed for age/gender General Gait Details: Pt c/o nausea during ambulation. Min guard for stability and safety but pt did not display sway with RW. VCs for sequencing and keeping head up.   Stairs            Wheelchair Mobility    Modified Rankin (Stroke Patients Only)       Balance Overall balance assessment: Needs assistance   Sitting balance-Leahy Scale: Good     Standing balance support: Bilateral upper extremity supported Standing balance-Leahy Scale: Poor Standing balance comment: use of RW                             Pertinent Vitals/Pain Pain Assessment: 0-10 Pain Score: 8  Pain Location: R Knee Pain Descriptors / Indicators: Aching Pain Intervention(s): Monitored during session;Repositioned;Ice applied    Home Living Family/patient expects to be discharged to:: Private residence Living Arrangements: Spouse/significant other Available Help at Discharge: Family Type of Home: House Home Access: Stairs to enter Entrance Stairs-Rails: Left Entrance Stairs-Number of Steps: 3 Home Layout: One level Home Equipment: [place] - 2 wheels;Cane - single point;Crutches;Bedside commode      Prior Function Level of Independence: Independent               Hand Dominance   Dominant Hand: Right    Extremity/Trunk Assessment   Upper Extremity Assessment: Overall WFL for tasks assessed           Lower Extremity Assessment: RLE deficits/detail  RLE Deficits / Details: Decreased quad control and ROM secondary to TKA    Cervical / Trunk Assessment: Normal  Communication   Communication: No difficulties  Cognition Arousal/Alertness: Awake/alert Behavior During Therapy: WFL for tasks assessed/performed Overall Cognitive Status: Within Functional Limits for tasks assessed                      General Comments       Exercises Total Joint Exercises Quad Sets: AROM;Right;10 reps;Supine Heel Slides: AROM;10 reps;Right;Supine      Assessment/Plan    PT Assessment Patient needs continued PT services  PT Diagnosis Difficulty walking;Abnormality of gait;Acute pain   PT Problem List Decreased strength;Decreased range of motion;Decreased activity tolerance;Decreased balance;Decreased mobility;Decreased safety awareness;Decreased knowledge of use of DME;Pain  PT Treatment Interventions DME instruction;Gait training;Stair training;Functional mobility training;Therapeutic activities;Therapeutic exercise;Balance training;Patient/family education   PT Goals (Current goals can be found in the Care Plan section) Acute Rehab PT Goals Patient Stated Goal: return home and climb stairs PT Goal Formulation: With patient Time For Goal Achievement: 11/17/15 Potential to Achieve Goals: Good    Frequency 7X/week   Barriers to discharge        Co-evaluation               End of Session Equipment Utilized During Treatment: Gait belt Activity Tolerance: Patient limited by fatigue Patient left: in chair;with call bell/phone within reach;with family/visitor present Nurse Communication: Mobility status;Weight bearing status;Other (comment) (Notified nurse of pt's nausea)         Time: [id number] PT Time Calculation (min) (ACUTE ONLY): 20 min   Charges:   PT Evaluation $Initial PT Evaluation Tier I: 1 Procedure     PT G CodesHaynes Bast 11-Nov-2015, 1:52 PM Haynes Bast, SPT Nov 11, 2015 1:52 PM

## 2015-11-10 NOTE — Progress Notes (Signed)
Orthopedic Tech Progress Note Patient Details:  Tyler Maynard 1951/05/29 [id number]  CPM Right Knee CPM Right Knee: On Right Knee Flexion (Degrees): 40 Right Knee Extension (Degrees): 10 Additional Comments: Trapeze bar and foot roll   [name] 11/10/2015, 10:22 AM

## 2015-11-10 NOTE — Op Note (Signed)
PATIENT ID:      Tyler Maynard  MRN:     [id number] DOB/AGE:    1951/08/15 / 64 y.o.       OPERATIVE REPORT    DATE OF PROCEDURE:  11/10/2015       PREOPERATIVE DIAGNOSIS:   RIGHT KNEE OSTEOARTHRITIS      Estimated body mass index is 38.36 kg/(m^2) as calculated from the following:   Height as of 10/30/15: 6' (1.829 m).   Weight as of 06/13/15: 128.323 kg (282 lb 14.4 oz).                                                        POSTOPERATIVE DIAGNOSIS:   RIGHT KNEE OSTEOARTHRITIS                                                                      PROCEDURE:  Procedure(s): TOTAL KNEE ARTHROPLASTY Using DepuyAttune RP implants #8R Femur, #9Tibia, 5 mm Attune RP bearing, 41 Patella     SURGEON: Daveigh Batty J    ASSISTANT:   [employer]   (Present and scrubbed throughout the case, critical for assistance with exposure, retraction, instrumentation, and closure.)         ANESTHESIA: GET, 20cc Exparel, 20cc 0.5% Marcaine  EBL: 300  FLUID REPLACEMENT: 1500 crystalloid  TOURNIQUET TIME: 68min  Drains: None  Tranexamic Acid: 1gm iv, 2 gm topical   COMPLICATIONS:  None         INDICATIONS FOR PROCEDURE: The patient has  RIGHT KNEE OSTEOARTHRITIS, Varus deformities, XR shows bone on bone arthritis, lateral subluxation of tibia. Patient has failed all conservative measures including anti-inflammatory medicines, narcotics, attempts at  exercise and weight loss, cortisone injections and viscosupplementation.  Risks and benefits of surgery have been discussed, questions answered.   DESCRIPTION OF PROCEDURE: The patient identified by armband, received  IV antibiotics, in the holding area at [facility]. Patient taken to the operating room, appropriate anesthetic  monitors were attached, and GET anesthesia was  induced. Tourniquet  applied high to the operative thigh. Lateral post and foot positioner  applied to the table, the lower extremity was then prepped and draped  in usual  sterile fashion from the toes to the tourniquet. Time-out procedure was performed. We began the operation, with the knee flexed 120 degrees, by making the anterior midline incision starting at handbreadth above the patella going over the patella 1 cm medial to and 4 cm distal to the tibial tubercle. Small bleeders in the skin and the  subcutaneous tissue identified and cauterized. Transverse retinaculum was incised and reflected medially and a medial parapatellar arthrotomy was accomplished. the patella was everted and theprepatellar fat pad resected. The superficial medial collateral  ligament was then elevated from anterior to posterior along the proximal  flare of the tibia and anterior half of the menisci resected. The knee was hyperflexed exposing bone on bone arthritis. Peripheral and notch osteophytes as well as the cruciate ligaments were then resected. We continued to  work our way around posteriorly along the proximal  tibia, and externally  rotated the tibia subluxing it out from underneath the femur. A McHale  retractor was placed through the notch and a lateral Hohmann retractor  placed, and we then drilled through the proximal tibia in line with the  axis of the tibia followed by an intramedullary guide rod and 2-degree  posterior slope cutting guide. The tibial cutting guide, 3 degree posterior sloped, was pinned into place allowing resection of 0 mm of bone medially and 10 mm of bone laterally. Satisfied with the tibial resection, we then  entered the distal femur 2 mm anterior to the PCL origin with the  intramedullary guide rod and applied the distal femoral cutting guide  set at 9 mm, with 5 degrees of valgus. This was pinned along the  epicondylar axis. At this point, the distal femoral cut was accomplished without difficulty. We then sized for a #8R femoral component and pinned the guide in 3 degrees of external rotation. The chamfer cutting guide was pinned into place. The anterior,  posterior, and chamfer cuts were accomplished without difficulty followed by  the Attune RP box cutting guide and the box cut. We also removed posterior osteophytes from the posterior femoral condyles. At this  time, the knee was brought into full extension. We checked our  extension and flexion gaps and found them symmetric for a 5 mm bearing. Distracting in extension with a lamina spreader, the posterior horns of the menisci were removed, and Exparel, diluted to 60 cc, with 20cc NS, and 20cc 0.5% Marcaine,was injected into the capsule and synovium of the knee. The posterior patella cut was accomplished with the 9.5 mm Attune cutting guide, sized for a 41mm dome, and the fixation pegs drilled.The knee  was then once again hyperflexed exposing the proximal tibia. We sized for a # 9 tibial base plate, applied the smokestack and the conical reamer followed by the the Delta fin keel punch. We then hammered into place the Attune RP trial femoral component, drilled the lugs, inserted a  5 mm trial bearing, trial patellar button, and took the knee through range of motion from 0-130 degrees. No thumb pressure was required for patellar Tracking. At this point, the limb was wrapped with an Esmarch bandage and the tourniquet inflated to 350 mmHg. All trial components were removed, mating surfaces irrigated with pulse lavage, and dried with suction and sponges. A double batch of DePuy HV cement with 1500 mg of Zinacef was mixed and applied to all bony metallic mating surfaces except for the posterior condyles of the femur itself. In order, we  hammered into place the tibial tray and removed excess cement, the femoral component and removed excess cement. The final Attune RP bearing  was inserted, and the knee brought to full extension with compression.  The patellar button was clamped into place, and excess cement  removed. While the cement cured the wound was irrigated out with normal saline solution pulse lavage.  Ligament stability and patellar tracking were checked and found to be excellent. The parapatellar arthrotomy was closed with  running #1 Vicryl suture. The subcutaneous tissue with 0 and 2-0 undyed  Vicryl suture, and the skin with running 3-0 SQ vicryl. A dressing of Xeroform,  4 x 4, dressing sponges, Webril, and Ace wrap applied. The patient  awakened, and taken to recovery room without difficulty.   Hagop Mccollam J 11/10/2015, 9:23 AM

## 2015-11-10 NOTE — Anesthesia Postprocedure Evaluation (Signed)
Anesthesia Post Note  Patient: Tyler Maynard  Procedure(s) Performed: Procedure(s) (LRB): TOTAL KNEE ARTHROPLASTY (Right)  Patient location during evaluation: PACU Anesthesia Type: General and Regional Level of consciousness: awake and alert Pain management: pain level controlled Vital Signs Assessment: post-procedure vital signs reviewed and stable Respiratory status: spontaneous breathing Cardiovascular status: blood pressure returned to baseline Anesthetic complications: no    Last Vitals:  Filed Vitals:   11/10/15 1130 11/10/15 1150  BP:  135/75  Pulse: 71 69  Temp:  36.8 C  Resp: 13 14    Last Pain:  Filed Vitals:   11/10/15 1201  PainSc: Asleep                 Tiajuana Amass

## 2015-11-10 NOTE — Interval H&P Note (Signed)
History and Physical Interval Note:  11/10/2015 7:18 AM  Tyler Maynard  has presented today for surgery, with the diagnosis of RIGHT KNEE OSTEOARTHRITIS  The various methods of treatment have been discussed with the patient and family. After consideration of risks, benefits and other options for treatment, the patient has consented to  Procedure(s): TOTAL KNEE ARTHROPLASTY (Right) as a surgical intervention .  The patient's history has been reviewed, patient examined, no change in status, stable for surgery.  I have reviewed the patient's chart and labs.  Questions were answered to the patient's satisfaction.     Kerin Salen

## 2015-11-10 NOTE — Progress Notes (Signed)
Pt snoring sleeping when awaken to ask pain scale and notify pt moving to 5n28 pt states his pain is a 9 pt falls right back to slleep decision made not to administer additional pain med

## 2015-11-10 NOTE — Discharge Instructions (Signed)

## 2015-11-11 ENCOUNTER — Encounter (HOSPITAL_COMMUNITY): Payer: Self-pay | Admitting: Orthopedic Surgery

## 2015-11-11 LAB — CBC
HEMATOCRIT: 36.9 % — AB (ref 39.0–52.0)
Hemoglobin: 11.9 g/dL — ABNORMAL LOW (ref 13.0–17.0)
MCH: 26.9 pg (ref 26.0–34.0)
MCHC: 32.2 g/dL (ref 30.0–36.0)
MCV: 83.5 fL (ref 78.0–100.0)
PLATELETS: 218 10*3/uL (ref 150–400)
RBC: 4.42 MIL/uL (ref 4.22–5.81)
RDW: 15.2 % (ref 11.5–15.5)
WBC: 8.5 10*3/uL (ref 4.0–10.5)

## 2015-11-11 LAB — BASIC METABOLIC PANEL
ANION GAP: 12 (ref 5–15)
BUN: 12 mg/dL (ref 6–20)
CALCIUM: 8.9 mg/dL (ref 8.9–10.3)
CO2: 23 mmol/L (ref 22–32)
CREATININE: 1.06 mg/dL (ref 0.61–1.24)
Chloride: 97 mmol/L — ABNORMAL LOW (ref 101–111)
GFR calc Af Amer: >60 mL/min (ref >60)
GLUCOSE: 156 mg/dL — AB (ref 65–99)
Potassium: 3.8 mmol/L (ref 3.5–5.1)
Sodium: 132 mmol/L — ABNORMAL LOW (ref 135–145)

## 2015-11-11 MED ORDER — HYDROMORPHONE HCL 2 MG PO TABS
4.0000 mg | ORAL_TABLET | ORAL | Status: DC | PRN
  Administered 2015-11-11: 4 mg via ORAL
  Filled 2015-11-11: qty 2

## 2015-11-11 MED ORDER — SODIUM CHLORIDE 0.9 % IV BOLUS (SEPSIS): 500.0000 mL | Freq: Once | INTRAVENOUS | Status: DC

## 2015-11-11 NOTE — Progress Notes (Signed)
Patient was moved from bed to the chair. Patient became dizzy, nauseous and diaphoretic. SBP was in 80s, 90s. MD on call was notified. The order received to give 500 NS bolus. Continue to monitor the patient.

## 2015-11-11 NOTE — Progress Notes (Signed)
Physical Therapy Progress Note Pt with limited tolerance for weight bearing on RLE due to pain during gait. Increased control of RLE with bed mobility and continues to improve ROM. Pt safe for D/C.   11/11/15 1100  PT Visit Information  Last PT Received On 11/11/15  Assistance Needed +1  History of Present Illness Pt is a 64 yo admitted for R TKA. PMHx: L TKA 05/2015, HTN, sleep apnea  PT Time Calculation  PT Start Time (ACUTE ONLY) 1144  PT Stop Time (ACUTE ONLY) 1203  PT Time Calculation (min) (ACUTE ONLY) 19 min  Precautions  Precautions Knee  Restrictions  RLE Weight Bearing WBAT  Pain Assessment  Pain Score 8  Pain Location right knee  Pain Descriptors / Indicators Aching;Sore  Pain Intervention(s) Limited activity within patient's tolerance;Monitored during session;Premedicated before session;Repositioned  Cognition  Arousal/Alertness Awake/alert  Behavior During Therapy WFL for tasks assessed/performed  Overall Cognitive Status Within Functional Limits for tasks assessed  Bed Mobility  Overal bed mobility Modified Independent  Transfers  Overall transfer level Needs assistance  Sit to Stand Supervision  General transfer comment cues for hand placement  Ambulation/Gait  Ambulation/Gait assistance Supervision  Ambulation Distance (Feet) 225 Feet  Assistive device Rolling walker (2 wheeled)  Gait Pattern/deviations Step-through pattern;Decreased stride length;Trunk flexed  General Gait Details cues for posture, sequence, position in RW, and heel strike  Gait velocity interpretation Below normal speed for age/gender  Exercises  Exercises Total Joint  Total Joint Exercises  Straight Leg Raises AAROM;Right;10 reps;Supine  Long Arc Quad AROM;Right;15 reps;Seated  Marching in Standing AROM;Right;15 reps;Seated  PT - End of Session  Equipment Utilized During Treatment Gait belt  Activity Tolerance Patient tolerated treatment well  Patient left in chair;with call bell/phone  within reach;with family/visitor present  Nurse Communication Mobility status  PT - Assessment/Plan  PT Plan Current plan remains appropriate  Follow Up Recommendations Home health PT  PT Goal Progression  Progress towards PT goals Progressing toward goals  PT General Charges  $$ ACUTE PT VISIT 1 Procedure  PT Treatments  $Gait Training 8-22 mins  Elwyn Reach, [place]

## 2015-11-11 NOTE — Care Management Note (Signed)
Case Management Note  Patient Details  Name: Tyler Maynard MRN: [id number] Date of Birth: Aug 01, 1951  Subjective/Objective:       S/p right total knee arthroplasty             Action/Plan:his girlfrie Set up with [facility] for HHPT, [place] and [place] by MD office. Spoke with patient, no change in discharge plan. T and [place] will deliver CPM to patient's home. Patient states he has a rolling walker and 3N1 at home. Patient states that his girlfriend and daughter will be able to assist him after discharge.     Expected Discharge Date:                  Expected Discharge Plan:  [place]  In-House Referral:  NA  Discharge planning Services  CM Consult  Post Acute Care Choice:  Home Health, Durable Medical Equipment Choice offered to:  Patient  DME Arranged:  CPM DME Agency:  [employer]  HH Arranged:  PT [place] Agency:  [facility]  Status of Service:  Completed, signed off  Medicare Important Message Given:    Date Medicare IM Given:    Medicare IM give by:    Date Additional Medicare IM Given:    Additional Medicare Important Message give by:     If discussed at [place] of Stay Meetings, dates discussed:    Additional Comments:  Nila Nephew, RN 11/11/2015, 11:20 AM

## 2015-11-11 NOTE — Progress Notes (Signed)
Physical Therapy Treatment Patient Details Name: Tyler Maynard MRN: [id number] DOB: 05-20-1951 Today's Date: 11/11/2015    History of Present Illness Pt is a 64 yo admitted for R TKA. PMHx: L TKA 05/2015, HTN, sleep apnea    PT Comments    Pt was pleasant and much more attentive today. He continues to have R LE weakness, decreased ROM, and impaired gait. Pt was unable to WB on R LE without support of railing or RW. Educated on backwards stair navigation with RW and he performed it adequately. Will continue to follow to improve strength, ROM, and functional mobility.   Follow Up Recommendations  Home health PT     Equipment Recommendations       Recommendations for Other Services       Precautions / Restrictions Precautions Precautions: Knee Restrictions Weight Bearing Restrictions: Yes RLE Weight Bearing: Weight bearing as tolerated    Mobility  Bed Mobility Overal bed mobility: Modified Independent             General bed mobility comments:    Transfers Overall transfer level: Needs assistance Equipment used: Rolling walker (2 wheeled) Transfers: Sit to/from Stand Sit to Stand: Min assist       General transfer comment: cues for hand placement  Ambulation/Gait Ambulation/Gait assistance: Supervision Ambulation Distance (Feet): 200 Feet Assistive device: Rolling walker (2 wheeled) Gait Pattern/deviations: Step-through pattern;Decreased stride length   Gait velocity interpretation: Below normal speed for age/gender General Gait Details: cues for posture, sequence, position in RW, and heel strike   Stairs Stairs: Yes Stairs assistance: Min assist Stair Management: Backwards;With walker Number of Stairs: 2 General stair comments: cues for sequence and assist for RW stability, handout provided  Wheelchair Mobility    Modified Rankin (Stroke Patients Only)       Balance Overall balance assessment: Needs assistance Sitting-balance support: Feet  unsupported;No upper extremity supported Sitting balance-Leahy Scale: Normal     Standing balance support: Bilateral upper extremity supported Standing balance-Leahy Scale: Fair Standing balance comment: Can balance without RW in static standing but requires RW during movement for stability                    Cognition Arousal/Alertness: Awake/alert Behavior During Therapy: WFL for tasks assessed/performed Overall Cognitive Status: Within Functional Limits for tasks assessed                      Exercises Total Joint Exercises Quad Sets: AROM;Right;5 reps;Supine Heel Slides: Right;Supine;AAROM;10 reps Straight Leg Raises: AAROM;Right;10 reps;Seated Goniometric ROM: 1-48    General Comments        Pertinent Vitals/Pain Pain Assessment: 0-10 Pain Score: 10-Worst pain ever Pain Location: R knee Pain Descriptors / Indicators: Aching;Sore;Guarding Pain Intervention(s): Limited activity within patient's tolerance;Monitored during session;Premedicated before session;Repositioned;Ice applied    Home Living Family/patient expects to be discharged to:: Private residence Living Arrangements: Spouse/significant other Available Help at Discharge: Family Type of Home: House Home Access: Stairs to enter Entrance Stairs-Rails: Left Home Layout: One level Home Equipment: Environmental consultant - 2 wheels;Cane - single point;Crutches;Bedside commode      Prior Function Level of Independence: Independent          PT Goals (current goals can now be found in the care plan section) Acute Rehab PT Goals Patient Stated Goal: return home and climb stairs Progress towards PT goals: Progressing toward goals    Frequency       PT Plan Current plan remains appropriate  Co-evaluation             End of Session Equipment Utilized During Treatment: Gait belt Activity Tolerance: Patient tolerated treatment well Patient left: in bed;in CPM;with family/visitor present;with call  bell/phone within reach     Time: 0934-1002 PT Time Calculation (min) (ACUTE ONLY): 28 min  Charges:  $Gait Training: 8-22 mins $Therapeutic Exercise: 8-22 mins                    G CodesHaynes Bast 11-20-15, 10:15 AM Haynes Bast, SPT November 20, 2015 10:15 AM

## 2015-11-11 NOTE — Progress Notes (Signed)
Patient ID: Tyler Maynard, male   DOB: Aug 27, 1951, 64 y.o.   MRN: [id number] PATIENT ID: Tyler Maynard  MRN: [id number]  DOB/AGE:  1951/01/29 / 64 y.o.  1 Day Post-Op Procedure(s) (LRB): TOTAL KNEE ARTHROPLASTY (Right)    PROGRESS NOTE Subjective: Patient is alert, oriented, no Nausea, no Vomiting, yes passing gas. Taking PO well. Denies SOB, Chest or Calf Pain. Using Incentive Spirometer, PAS in place. Ambulate 12 ft, CPM 0-40 Patient reports pain as 3/10 .    Objective: Vital signs in last 24 hours: Filed Vitals:   11/10/15 1656 11/10/15 2007 11/11/15 0555 11/11/15 0617  BP:  111/65 91/51 117/66  Pulse:  65 68   Temp:  98.4 F (36.9 C)    TempSrc:  Oral    Resp:  16    SpO2: 98% 97%        Intake/Output from previous day: I/O last 3 completed shifts: In: 2304.2 [I.V.:2304.2] Out: 2000 [Urine:1550; Blood:450]   Intake/Output this shift:     LABORATORY DATA: No results for input(s): WBC, HGB, HCT, PLT, NA, K, CL, CO2, BUN, CREATININE, GLUCOSE, GLUCAP, INR, CALCIUM in the last 72 hours.  Invalid input(s): PT, 2  Examination: Neurologically intact ABD soft Neurovascular intact Sensation intact distally Intact pulses distally Dorsiflexion/Plantar flexion intact Incision: dressing C/D/I No cellulitis present Compartment soft}  Assessment:   1 Day Post-Op Procedure(s) (LRB): TOTAL KNEE ARTHROPLASTY (Right) ADDITIONAL DIAGNOSIS: Expected Acute Blood Loss Anemia, Hypertension. obesity  Plan: PT/OT WBAT, CPM 5/hrs day until ROM 0-90 degrees, then D/C CPM DVT Prophylaxis:  SCDx72hrs, ASA 325 mg BID x 2 weeks DISCHARGE PLAN: Home, today if passesPT DISCHARGE NEEDS: HHPT, CPM, Walker and 3-in-1 comode seat     Verlie Hellenbrand J 11/11/2015, 7:14 AM

## 2015-11-11 NOTE — Evaluation (Signed)
Occupational Therapy Evaluation Patient Details Name: Tyler Maynard MRN: [id number] DOB: 1951-09-28 Today's Date: 11/11/2015    History of Present Illness Pt is a 64 yo admitted for R TKA. PMHx: L TKA 05/2015, HTN, sleep apnea   Clinical Impression   Patient presenting with deconditioning, decreased I in self care, decreased I in functional mobility/transfers, balance, and increased pain.  Patient reports being I PTA. Patient currently functioning min - mod A overall. Patient will benefit from acute OT to increase overall independence in the areas of ADLs, functional mobility, and safety in order to safely discharge home.    Follow Up Recommendations  No OT follow up;Supervision/Assistance - 24 hour    Equipment Recommendations  3 in 1 bedside comode    Recommendations for Other Services       Precautions / Restrictions Precautions Precautions: Knee Restrictions Weight Bearing Restrictions: No      Mobility Bed Mobility     General bed mobility comments: pt seated in recliner chair upon entering the room.   Transfers Overall transfer level: Needs assistance Equipment used: Rolling walker (2 wheeled) Transfers: Sit to/from [facility] Sit to Stand: Min assist Stand pivot transfers: Min guard       General transfer comment: Min verbal cues for hand placement and proper technique.     Balance Overall balance assessment: Needs assistance Sitting-balance support: Feet supported Sitting balance-Leahy Scale: Good     Standing balance support: During functional activity;Bilateral upper extremity supported Standing balance-Leahy Scale: Poor Standing balance comment: Pt always holding onto RW with use of L or R hand during grooming task while standing at sink            ADL Overall ADL's : Needs assistance/impaired        General ADL Comments: Upon entering the room, pt seated in recliner chair. Pt requiring min A for sit <>stand from surface  and pt ambulating 15' to bathroom with RW and min guard. Pt performed toilet transfer onto elevated toilet seat with min A as well. Pt standing at sink side for grooming tasks but uses RW for support with balance. Pt returning to recliner chair at end of session with ice applies and foot placed in bone foam.                Pertinent Vitals/Pain Pain Assessment: 0-10 Pain Score: 10-Worst pain ever Pain Location: R knee Pain Descriptors / Indicators: Aching;Sore;Guarding Pain Intervention(s): Limited activity within patient's tolerance;Monitored during session;Premedicated before session;Repositioned;Ice applied     Hand Dominance Right   Extremity/Trunk Assessment Upper Extremity Assessment Upper Extremity Assessment: Overall WFL for tasks assessed   Lower Extremity Assessment Lower Extremity Assessment: Defer to PT evaluation   Cervical / Trunk Assessment Cervical / Trunk Assessment: Normal   Communication Communication Communication: No difficulties   Cognition Arousal/Alertness: Awake/alert Behavior During Therapy: WFL for tasks assessed/performed Overall Cognitive Status: Within Functional Limits for tasks assessed                                Home Living Family/patient expects to be discharged to:: Private residence Living Arrangements: Spouse/significant other Available Help at Discharge: Family Type of Home: House Home Access: Stairs to enter Technical brewer of Steps: 3 Entrance Stairs-Rails: Left Home Layout: One level     Bathroom Shower/Tub: Corporate investment banker: Standard Bathroom Accessibility: Yes   Home Equipment: Environmental consultant - 2 wheels;Cane - single  point;Crutches;Bedside commode          Prior Functioning/Environment Level of Independence: Independent             OT Diagnosis: Acute pain   OT Problem List: Decreased strength;Decreased range of motion;Decreased activity tolerance;Impaired balance  (sitting and/or standing);Decreased safety awareness;Pain;Cardiopulmonary status limiting activity;Increased edema;Decreased knowledge of use of DME or AE;Decreased knowledge of precautions   OT Treatment/Interventions: Self-care/ADL training;Therapeutic exercise;Energy conservation;DME and/or AE instruction;Patient/family education;Therapeutic activities;Balance training    OT Goals(Current goals can be found in the care plan section) Acute Rehab OT Goals Patient Stated Goal: return home and climb stairs OT Goal Formulation: With patient Time For Goal Achievement: 11/25/15 Potential to Achieve Goals: Fair ADL Goals Pt Will Perform Grooming: with modified independence;standing Pt Will Perform Upper Body Bathing: with modified independence;sitting Pt Will Perform Lower Body Bathing: with modified independence;with adaptive equipment;sit to/from stand Pt Will Perform Upper Body Dressing: with modified independence;sitting Pt Will Perform Lower Body Dressing: with modified independence;with adaptive equipment;sit to/from stand Pt Will Transfer to Toilet: with modified independence;bedside commode Pt Will Perform Toileting - Clothing Manipulation and hygiene: with modified independence;sit to/from stand Pt Will Perform Tub/Shower Transfer: Tub transfer;tub bench;rolling walker;Stand pivot transfer;ambulating  OT Frequency: Min 2X/week   Barriers to D/C:    none known at this time          End of Session Equipment Utilized During Treatment: Rolling walker CPM Right Knee CPM Right Knee: Off  Activity Tolerance: Patient limited by pain Patient left: in chair;with call bell/phone within reach;with family/visitor present;with nursing/sitter in room;Other (comment) (bone foam donned)   Time: [id number] OT Time Calculation (min): 28 min Charges:  OT General Charges $OT Visit: 1 Procedure OT Evaluation $Initial OT Evaluation Tier I: 1 Procedure OT Treatments $Self Care/Home Management :  8-22 mins G-Codes:    Phineas Semen, MS, OTR/L 11/11/2015, 9:24 AM

## 2015-11-11 NOTE — Discharge Summary (Signed)
Patient ID: Tyler Maynard MRN: [id number] DOB/AGE: [birth year] 64 y.o.  Admit date: 11/10/2015 Discharge date: 11/11/2015  Admission Diagnoses:  Active Problems:   Arthritis of knee   Discharge Diagnoses:  Same  Past Medical History  Diagnosis Date  . Hypertension   . Sleep apnea     borderline-lost weight  . Arthritis   . History of kidney stones     Surgeries: Procedure(s): TOTAL KNEE ARTHROPLASTY on 11/10/2015   Consultants:    Discharged Condition: Improved  Hospital Course: Tyler Maynard is an 64 y.o. male who was admitted 11/10/2015 for operative treatment of<principal problem not specified>. Patient has severe unremitting pain that affects sleep, daily activities, and work/hobbies. After pre-op clearance the patient was taken to the operating room on 11/10/2015 and underwent  Procedure(s): TOTAL KNEE ARTHROPLASTY.    Patient was given perioperative antibiotics: Anti-infectives    Start     Dose/Rate Route Frequency Ordered Stop   11/10/15 0600  ceFAZolin (ANCEF) 3 g in dextrose 5 % 50 mL IVPB     3 g 160 mL/hr over 30 Minutes Intravenous On call to O.R. 11/09/15 1855 11/10/15 0735       Patient was given sequential compression devices, early ambulation, and chemoprophylaxis to prevent DVT.  Patient benefited maximally from hospital stay and there were no complications.    Recent vital signs: Patient Vitals for the past 24 hrs:  BP Temp Temp src Pulse Resp SpO2  11/11/15 0617 117/66 mmHg - - - - -  11/11/15 0555 (!) 91/51 mmHg - - 68 - -  11/10/15 2007 111/65 mmHg 98.4 F (36.9 C) Oral 65 16 97 %  11/10/15 1656 - - - - - 98 %  11/10/15 1648 - - - - - 96 %  11/10/15 1410 140/70 mmHg - - 61 - 100 %  11/10/15 1150 135/75 mmHg 98.3 F (36.8 C) Oral 69 14 98 %     Recent laboratory studies:  Recent Labs  11/11/15 0654  WBC 8.5  HGB 11.9*  HCT 36.9*  PLT 218  NA 132*  K 3.8  CL 97*  CO2 23  BUN 12  CREATININE 1.06  GLUCOSE 156*  CALCIUM  8.9     Discharge Medications:     Medication List    STOP taking these medications        HYDROcodone-acetaminophen 10-325 MG tablet  Commonly known as:  NORCO     oxyCODONE-acetaminophen 5-325 MG tablet  Commonly known as:  ROXICET      TAKE these medications        aspirin EC 325 MG tablet  Take 1 tablet (325 mg total) by mouth 2 (two) times daily.     atorvastatin 10 MG tablet  Commonly known as:  LIPITOR  Take 10 mg by mouth at bedtime.     diclofenac sodium 1 % Gel  Commonly known as:  VOLTAREN  Apply 1 application topically 3 (three) times daily as needed (knee pain).     gemfibrozil 600 MG tablet  Commonly known as:  LOPID  Take 600 mg by mouth 2 (two) times daily.     HYDROmorphone 4 MG tablet  Commonly known as:  DILAUDID  Take 1 tablet (4 mg total) by mouth every 4 (four) hours as needed for severe pain.     lisinopril-hydrochlorothiazide 20-25 MG tablet  Commonly known as:  PRINZIDE,ZESTORETIC  Take 1 tablet by mouth daily.     methocarbamol 500 MG tablet  Commonly  known as:  ROBAXIN  Take 1 tablet (500 mg total) by mouth 2 (two) times daily with a meal.     montelukast 10 MG tablet  Commonly known as:  SINGULAIR  Take 10 mg by mouth at bedtime as needed (seasonal allergies).     testosterone cypionate 200 MG/ML injection  Commonly known as:  DEPOTESTOSTERONE CYPIONATE  Inject 200 mg into the muscle every 28 (twenty-eight) days.     Vitamin D (Cholecalciferol) 1000 UNITS Caps  Take 1,000 Units by mouth daily.        Diagnostic Studies: No results found.  Disposition: 06-Home-Health Care Svc      Discharge Instructions    CPM    Complete by:  As directed   Continuous passive motion machine (CPM):      Use the CPM from 0 to 60  for 5 hours per day.      You may increase by 10 degrees per day.  You may break it up into 2 or 3 sessions per day.      Use CPM for 2 weeks or until you are told to stop.     Call MD / Call 911    Complete  by:  As directed   If you experience chest pain or shortness of breath, CALL 911 and be transported to the hospital emergency room.  If you develope a fever above 101 F, pus (white drainage) or increased drainage or redness at the wound, or calf pain, call your surgeon's office.     Change dressing    Complete by:  As directed   Change dressing on day 3, then change the dressing daily with sterile 4 x 4 inch gauze dressing and apply TED hose.  You may clean the incision with alcohol prior to redressing.     Constipation Prevention    Complete by:  As directed   Drink plenty of fluids.  Prune juice may be helpful.  You may use a stool softener, such as Colace (over the counter) 100 mg twice a day.  Use MiraLax (over the counter) for constipation as needed.     Diet - low sodium heart healthy    Complete by:  As directed      Driving restrictions    Complete by:  As directed   No driving for 2 weeks     Increase activity slowly as tolerated    Complete by:  As directed      Patient may shower    Complete by:  As directed   You may shower without a dressing once there is no drainage.  Do not wash over the wound.  If drainage remains, cover wound with plastic wrap and then shower.           Follow-up Information    Follow up with Kerin Salen, MD In 2 weeks.   Specialty:  Orthopedic Surgery   Contact information:   [place] [address] [phone]       Follow up with [facility].   Why:  They will contact you to schedule home therapy visits.   Contact information:   [address] [place] [address] [phone]        Signed: Theodosia Quay 11/11/2015, 11:32 AM

## 2016-12-19 IMAGING — CR DG CHEST 2V
2 series · 2 of 2 positions shown · non-contrast
Comparison: None.

CLINICAL DATA: Preop for left knee replacement

EXAM:
CHEST  2 VIEW

[w chest pa]
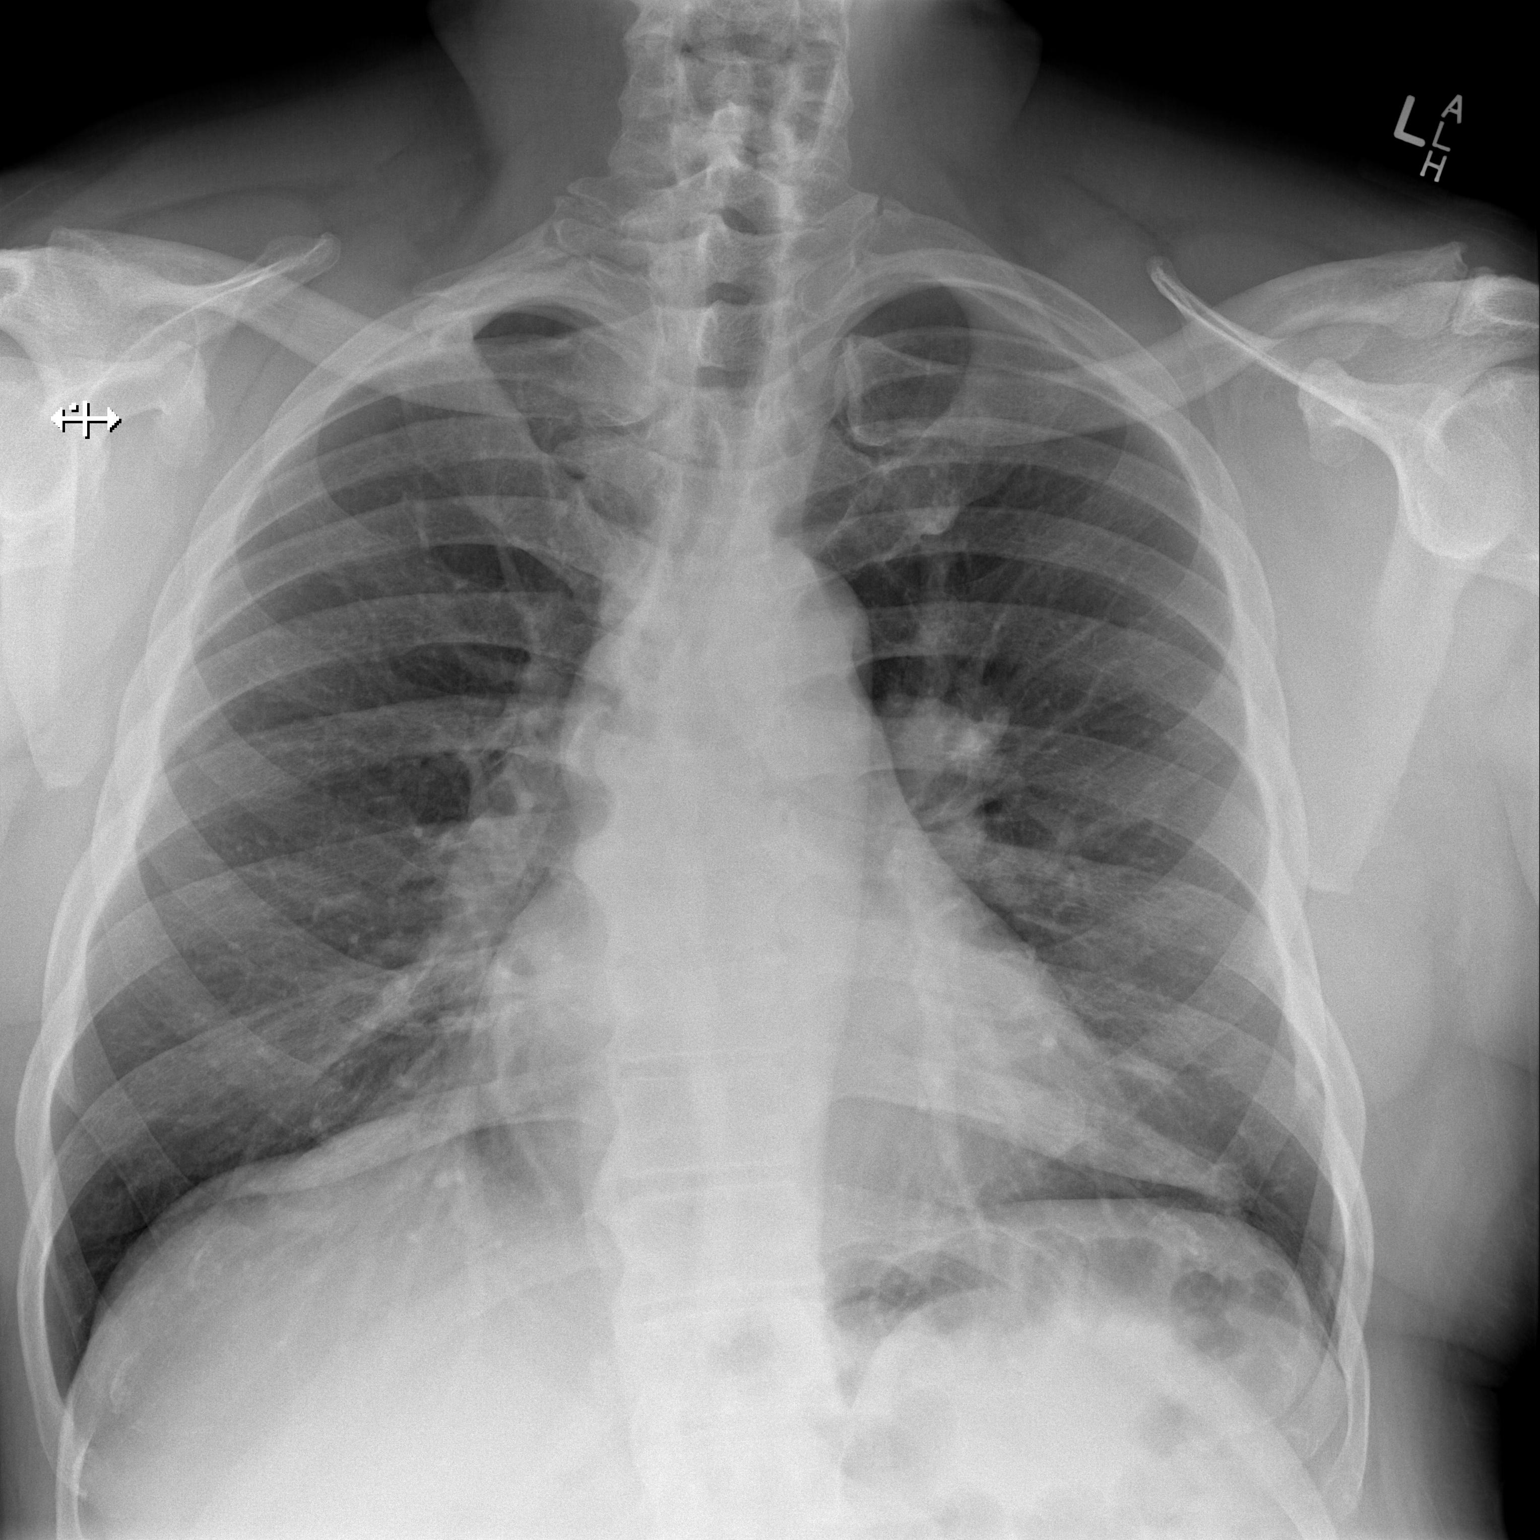

[w chest lat]
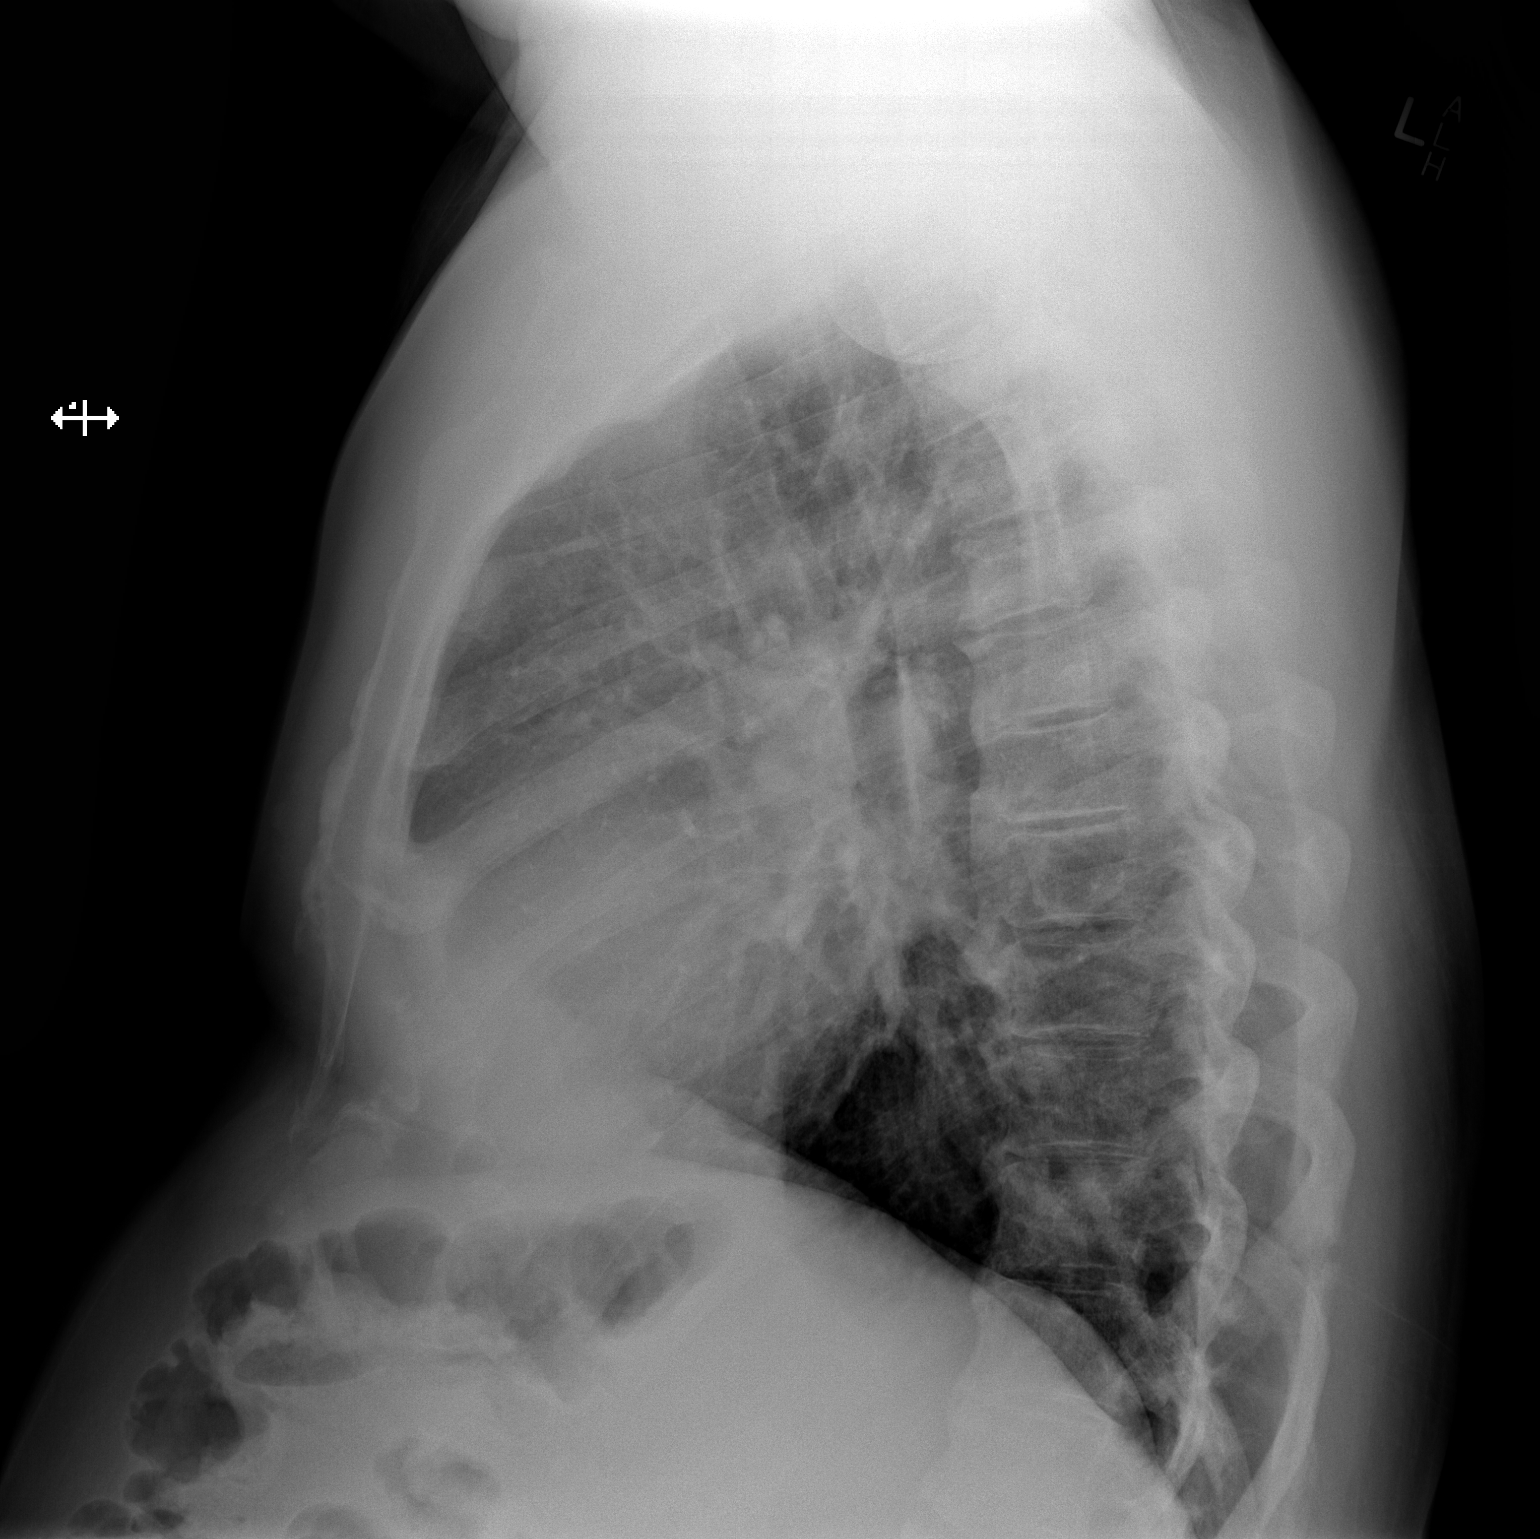

[2 of 2 positions shown; findings below may reference images not displayed]

FINDINGS: Cardiomediastinal silhouette is unremarkable. Mild thoracic
dextroscoliosis. Degenerative changes thoracic spine. No acute
infiltrate or pulmonary edema.
IMPRESSION: No active disease. Mild thoracic dextroscoliosis. Degenerative
changes thoracic spine.

## 2017-12-23 DIAGNOSIS — E785 Hyperlipidemia, unspecified: Secondary | ICD-10-CM | POA: Diagnosis not present

## 2017-12-23 DIAGNOSIS — Z2821 Immunization not carried out because of patient refusal: Secondary | ICD-10-CM | POA: Diagnosis not present

## 2017-12-23 DIAGNOSIS — I119 Hypertensive heart disease without heart failure: Secondary | ICD-10-CM | POA: Diagnosis not present

## 2017-12-23 DIAGNOSIS — Z01118 Encounter for examination of ears and hearing with other abnormal findings: Secondary | ICD-10-CM | POA: Diagnosis not present

## 2017-12-23 DIAGNOSIS — Z87891 Personal history of nicotine dependence: Secondary | ICD-10-CM | POA: Diagnosis not present

## 2017-12-23 DIAGNOSIS — Z131 Encounter for screening for diabetes mellitus: Secondary | ICD-10-CM | POA: Diagnosis not present

## 2017-12-23 DIAGNOSIS — Z0000 Encounter for general adult medical examination without abnormal findings: Secondary | ICD-10-CM | POA: Diagnosis not present

## 2017-12-23 DIAGNOSIS — R740 Nonspecific elevation of levels of transaminase and lactic acid dehydrogenase [LDH]: Secondary | ICD-10-CM | POA: Diagnosis not present

## 2018-01-20 DIAGNOSIS — E785 Hyperlipidemia, unspecified: Secondary | ICD-10-CM | POA: Diagnosis not present

## 2018-01-20 DIAGNOSIS — Z0000 Encounter for general adult medical examination without abnormal findings: Secondary | ICD-10-CM | POA: Diagnosis not present

## 2018-01-20 DIAGNOSIS — R740 Nonspecific elevation of levels of transaminase and lactic acid dehydrogenase [LDH]: Secondary | ICD-10-CM | POA: Diagnosis not present

## 2018-04-05 DIAGNOSIS — H6123 Impacted cerumen, bilateral: Secondary | ICD-10-CM | POA: Diagnosis not present

## 2018-04-05 DIAGNOSIS — I10 Essential (primary) hypertension: Secondary | ICD-10-CM | POA: Diagnosis not present

## 2018-04-07 DIAGNOSIS — R740 Nonspecific elevation of levels of transaminase and lactic acid dehydrogenase [LDH]: Secondary | ICD-10-CM | POA: Diagnosis not present

## 2018-04-07 DIAGNOSIS — I119 Hypertensive heart disease without heart failure: Secondary | ICD-10-CM | POA: Diagnosis not present

## 2018-04-07 DIAGNOSIS — I10 Essential (primary) hypertension: Secondary | ICD-10-CM | POA: Diagnosis not present

## 2018-04-07 DIAGNOSIS — Z87891 Personal history of nicotine dependence: Secondary | ICD-10-CM | POA: Diagnosis not present

## 2018-04-07 DIAGNOSIS — E785 Hyperlipidemia, unspecified: Secondary | ICD-10-CM | POA: Diagnosis not present

## 2018-04-07 DIAGNOSIS — R7303 Prediabetes: Secondary | ICD-10-CM | POA: Diagnosis not present

## 2018-04-28 DIAGNOSIS — I119 Hypertensive heart disease without heart failure: Secondary | ICD-10-CM | POA: Diagnosis not present

## 2018-04-28 DIAGNOSIS — J302 Other seasonal allergic rhinitis: Secondary | ICD-10-CM | POA: Diagnosis not present

## 2018-04-28 DIAGNOSIS — R740 Nonspecific elevation of levels of transaminase and lactic acid dehydrogenase [LDH]: Secondary | ICD-10-CM | POA: Diagnosis not present

## 2018-04-28 DIAGNOSIS — I10 Essential (primary) hypertension: Secondary | ICD-10-CM | POA: Diagnosis not present

## 2018-06-13 ENCOUNTER — Ambulatory Visit (INDEPENDENT_AMBULATORY_CARE_PROVIDER_SITE_OTHER): Payer: Medicare Other | Admitting: Medical

## 2018-06-13 ENCOUNTER — Encounter: Payer: Self-pay | Admitting: Medical

## 2018-06-13 VITALS — BP 130/80 | HR 82 | Temp 98.7°F | Resp 16 | Ht 72.0 in | Wt 320.6 lb

## 2018-06-13 DIAGNOSIS — I10 Essential (primary) hypertension: Secondary | ICD-10-CM

## 2018-06-13 DIAGNOSIS — R739 Hyperglycemia, unspecified: Secondary | ICD-10-CM

## 2018-06-13 DIAGNOSIS — J301 Allergic rhinitis due to pollen: Secondary | ICD-10-CM

## 2018-06-13 DIAGNOSIS — E785 Hyperlipidemia, unspecified: Secondary | ICD-10-CM

## 2018-06-13 MED ORDER — ATORVASTATIN CALCIUM 20 MG PO TABS: 20.0000 mg | ORAL_TABLET | Freq: Every day | ORAL | 3 refills | Status: DC

## 2018-06-13 NOTE — Patient Instructions (Addendum)
Your blood pressure is well controlled today.  Please continue with current blood pressure medication.  For your history of high cholesterol, I will go ahead and send in 20 mg of atorvastatin.  However since you do report history of elevated liver enzymes I would recommend that you not start this immediately but wait until your metabolic panel is back and we advise you on the results.  For elevated blood sugar, recommend low sugar diet and we will get A1c/three-month blood sugar test.  For history of allergic rhinitis, continue montelukast in the spring and fall.  Also remember not to drink any alcohol for 1 week prior to upcoming labs.  Need to be fasting for 8 hours prior to labs drawn.  I think reducing or stopping your alcohol use will improve your sugars, reduce your liver enzymes, reduce your sugar and help you lose weight.  Follow-up date to be determined after lab review.  A good time for follow-up will likely be in October as at that time you can get flu vaccine.

## 2018-06-13 NOTE — Progress Notes (Signed)
Subjective:    Patient ID: Tyler Maynard, male    DOB: 08/13/1951, 67 y.o.   MRN: [id number]  HPI   He in for first time.  He plans to start working out. Plans to work out 3-4 days a week. Pt is drinking 3-4 bottles of beer a dayNon- smoker. Widowed.  He does plan to cut back on alcohol use.   Pt has history of hyperlipidemia and htn. Pt states he never had fasting lab work in the past.  Pt bp initially was mild high today. Better on recheck.   Pt does have history of osteoarthritis of both knees. He had surgery for both in 2017. Knees feel great now.  Pt also has seasonal allergies. He takes that mostly in spring and fall. Montelukast helps both when he uses.  No longer using testosterone.Stopped about one year ago.  History of mild high sugars.  Former smoker.   Review of Systems  Constitutional: Negative for chills, fatigue and fever.  HENT: Negative for congestion and ear discharge.   Respiratory: Negative for apnea, chest tightness, shortness of breath and wheezing.   Cardiovascular: Negative for chest pain and palpitations.  Gastrointestinal: Negative for abdominal distention, abdominal pain, blood in stool, constipation, diarrhea, nausea and vomiting.  Musculoskeletal: Negative for arthralgias, back pain, neck pain and neck stiffness.  Skin: Negative for rash.  Neurological: Negative for dizziness, seizures, speech difficulty, weakness, numbness and headaches.  Hematological: Negative for adenopathy. Does not bruise/bleed easily.  Psychiatric/Behavioral: Negative for behavioral problems, confusion, dysphoric mood, hallucinations, sleep disturbance and suicidal ideas. The patient is not nervous/anxious.     Past Medical History:  Diagnosis Date  . Arthritis   . History of kidney stones   . Hypertension   . Sleep apnea    borderline-lost weight     Social History   Socioeconomic History  . Marital status: Widowed    Spouse name: Not on file  . Number  of children: Not on file  . Years of education: Not on file  . Highest education level: Not on file  Occupational History  . Not on file  Social Needs  . Financial resource strain: Not on file  . Food insecurity:    Worry: Not on file    Inability: Not on file  . Transportation needs:    Medical: Not on file    Non-medical: Not on file  Tobacco Use  . Smoking status: Former Smoker    Packs/day: 1.00    Years: 10.00    Pack years: 10.00    Types: Cigarettes    Last attempt to quit: 06/13/1995    Years since quitting: 23.0  . Smokeless tobacco: Never Used  Substance and Sexual Activity  . Alcohol use: Yes    Comment: social  . Drug use: No  . Sexual activity: Not on file  Lifestyle  . Physical activity:    Days per week: Not on file    Minutes per session: Not on file  . Stress: Not on file  Relationships  . Social connections:    Talks on phone: Not on file    Gets together: Not on file    Attends religious service: Not on file    Active member of club or organization: Not on file    Attends meetings of clubs or organizations: Not on file    Relationship status: Not on file  . Intimate partner violence:    Fear of current or ex partner: Not on  file    Emotionally abused: Not on file    Physically abused: Not on file    Forced sexual activity: Not on file  Other Topics Concern  . Not on file  Social History Narrative  . Not on file    Past Surgical History:  Procedure Laterality Date  . 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   Procedure: TOTAL KNEE ARTHROPLASTY;  Surgeon: Frederik Pear, MD;  Location: [facility];  Service: Orthopedics;  Laterality: Left;  Left Total Knee Arthroplasty  . 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 TOTAL KNEE ARTHROPLASTY Right 11/10/2015   Procedure: TOTAL KNEE ARTHROPLASTY;  Surgeon: Frederik Pear, MD;  Location: [facility];  Service: Orthopedics;  Laterality: Right;    No family history on  file.  No Known Allergies  Current Outpatient Medications on File Prior to Visit  Medication Sig Dispense Refill  . atorvastatin (LIPITOR) 10 MG tablet Take 10 mg by mouth at bedtime.  3  . lisinopril-hydrochlorothiazide (PRINZIDE,ZESTORETIC) 20-25 MG per tablet Take 1 tablet by mouth daily.  1  . montelukast (SINGULAIR) 10 MG tablet Take 10 mg by mouth at bedtime as needed (seasonal allergies).   1  . testosterone cypionate (DEPOTESTOSTERONE CYPIONATE) 200 MG/ML injection Inject 200 mg into the muscle every 28 (twenty-eight) days.  0  . Vitamin D, Cholecalciferol, 1000 UNITS CAPS Take 1,000 Units by mouth daily.    Marland Kitchen aspirin EC 325 MG tablet Take 1 tablet (325 mg total) by mouth 2 (two) times daily. 30 tablet 0  . diclofenac sodium (VOLTAREN) 1 % GEL Apply 1 application topically 3 (three) times daily as needed (knee pain).   0  . gemfibrozil (LOPID) 600 MG tablet Take 600 mg by mouth 2 (two) times daily.  1  . HYDROmorphone (DILAUDID) 4 MG tablet Take 1 tablet (4 mg total) by mouth every 4 (four) hours as needed for severe pain. (Patient not taking: Reported on 06/13/2018) 60 tablet 0  . methocarbamol (ROBAXIN) 500 MG tablet Take 1 tablet (500 mg total) by mouth 2 (two) times daily with a meal. (Patient not taking: Reported on 06/13/2018) 60 tablet 0   No current facility-administered medications on file prior to visit.     BP (!) 141/86   Pulse 82   Temp 98.7 F (37.1 C) (Oral)   Resp 16   Ht 6' (1.829 m)   Wt (!) 320 lb 9.6 oz (145.4 kg)   SpO2 98%   BMI 43.48 kg/m      Objective:   Physical Exam  General Mental Status- Alert. General Appearance- Not in acute distress.   Skin General: Color- Normal Color. Moisture- Normal Moisture.  Neck Carotid Arteries- Normal color. Moisture- Normal Moisture. No carotid bruits. No JVD.  Chest and Lung Exam Auscultation: Breath Sounds:-Normal.  Cardiovascular Auscultation:Rythm- Regular. Murmurs & Other Heart Sounds:Auscultation  of the heart reveals- No Murmurs.  Abdomen Inspection:-Inspeection Normal. Palpation/Percussion:Note:No mass. Palpation and Percussion of the abdomen reveal- Non Tender, Non Distended + BS, no rebound or guarding.   Neurologic Cranial Nerve exam:- CN III-XII intact(No nystagmus), symmetric smile. Strength:- 5/5 equal and symmetric strength both upper and lower extremities.      Assessment & Plan:  Your blood pressure is well controlled today.  Please continue with current blood pressure medication.  For your history of high cholesterol, I will go ahead and send in 20 mg of atorvastatin.  However since you do report history of elevated liver enzymes  I would recommend that you not start this immediately but wait until your metabolic panel is back and we advise you on the results.  For elevated blood sugar, recommend low sugar diet and we will get A1c/three-month blood sugar test.  For history of allergic rhinitis, continue montelukast in the spring and fall.  Follow-up date to be determined after lab review.  A good time for follow-up will likely be in October as at that time you can get flu vaccine.  Mackie Pai, PA-C

## 2018-06-20 ENCOUNTER — Other Ambulatory Visit (INDEPENDENT_AMBULATORY_CARE_PROVIDER_SITE_OTHER): Payer: Medicare Other

## 2018-06-20 ENCOUNTER — Telehealth: Payer: Self-pay | Admitting: Medical

## 2018-06-20 DIAGNOSIS — R739 Hyperglycemia, unspecified: Secondary | ICD-10-CM

## 2018-06-20 DIAGNOSIS — E785 Hyperlipidemia, unspecified: Secondary | ICD-10-CM

## 2018-06-20 DIAGNOSIS — I10 Essential (primary) hypertension: Secondary | ICD-10-CM | POA: Diagnosis not present

## 2018-06-20 LAB — COMPREHENSIVE METABOLIC PANEL
ALBUMIN: 4 g/dL (ref 3.5–5.2)
ALK PHOS: 82 U/L (ref 39–117)
ALT: 66 U/L — ABNORMAL HIGH (ref 0–53)
AST: 70 U/L — AB (ref 0–37)
BUN: 19 mg/dL (ref 6–23)
CALCIUM: 9.3 mg/dL (ref 8.4–10.5)
CO2: 29 mEq/L (ref 19–32)
CREATININE: 0.96 mg/dL (ref 0.40–1.50)
Chloride: 98 mEq/L (ref 96–112)
GFR: 100.88 mL/min (ref >60.00)
Glucose, Bld: 116 mg/dL — ABNORMAL HIGH (ref 70–99)
POTASSIUM: 4.2 meq/L (ref 3.5–5.1)
Sodium: 135 mEq/L (ref 135–145)
TOTAL PROTEIN: 7.1 g/dL (ref 6.0–8.3)
Total Bilirubin: 1 mg/dL (ref 0.2–1.2)

## 2018-06-20 LAB — LIPID PANEL
CHOLESTEROL: 182 mg/dL (ref 0–200)
HDL: 27.9 mg/dL — ABNORMAL LOW (ref >39.00)
LDL CALC: 124 mg/dL — AB (ref 0–99)
NonHDL: 153.63
TRIGLYCERIDES: 147 mg/dL (ref 0.0–149.0)
Total CHOL/HDL Ratio: 7
VLDL: 29.4 mg/dL (ref 0.0–40.0)

## 2018-06-20 LAB — HEMOGLOBIN A1C: HEMOGLOBIN A1C: 6.8 % — AB (ref 4.6–6.5)

## 2018-06-20 MED ORDER — ATORVASTATIN CALCIUM 20 MG PO TABS
20.0000 mg | ORAL_TABLET | Freq: Every day | ORAL | 1 refills | Status: DC
Start: 2018-06-20 — End: 2018-11-29

## 2018-06-20 MED ORDER — METFORMIN HCL ER 500 MG PO TB24: 500.0000 mg | ORAL_TABLET | Freq: Every day | ORAL | 1 refills | Status: DC

## 2018-06-20 NOTE — Telephone Encounter (Signed)
Refill of lipitor sent to pt pharmacy.

## 2018-06-20 NOTE — Telephone Encounter (Signed)
Metformin sent to pt pharmacy. 

## 2018-06-21 ENCOUNTER — Telehealth: Payer: Self-pay | Admitting: Medical

## 2018-06-21 NOTE — Telephone Encounter (Signed)
Opened to review 

## 2018-06-21 NOTE — Telephone Encounter (Signed)
I reviewed pt lab/lipid panel last night and refilled his lipitor. Not sure why not from [facility] states he just got done today? Might ask lab about this. Did I got labs from wrong person.

## 2018-08-18 ENCOUNTER — Telehealth: Payer: Self-pay | Admitting: Medical

## 2018-08-18 NOTE — Telephone Encounter (Signed)
Pt came into office dropping off form that he needs completed to show he has been disabled for more than 3 years for a [employer] Pt would like for someone to call when it is completed and fax to number on form. Document is in [place] bin in the front office.

## 2018-08-24 NOTE — Telephone Encounter (Signed)
Paperwork has Social Security information included in it showing pt's SSDI monthly received, but he has marked [No] to the question as to if he receives this income. Paperwork also includes form to complete that he has been disabled since 11/2013 and warrants a medical issue that would pardon him from paying back a federal/government loan. I can only see back to 2016 in EMR, so I have no reason and/or diagnosis to validate this request. Patient has been seen once by provider and again gave no indication of disability. Provider has been made aware and paperwork has been forwarded to PCP/SLS 09/26

## 2018-08-27 ENCOUNTER — Telehealth: Payer: Self-pay | Admitting: Medical

## 2018-08-27 NOTE — Telephone Encounter (Signed)
Wanted to get advise on how your would proceed with this type request. As Ivin Booty noted I have only seen patient one time and was unaware he had history of disability? Someone else saw him in past? I glanced at the form. I have never filled out form like this. Appears it possibly if I fill out form certain way then he might able to get loan obligation dismissed? I will try to make a point to come over and bring form to you to get your opinion as to how to potentially fill out form?

## 2018-08-27 NOTE — Telephone Encounter (Signed)
Note I first was informed of patient disablity loan form on 08-24-2018.

## 2018-08-28 ENCOUNTER — Telehealth: Payer: Self-pay | Admitting: Medical

## 2018-08-28 NOTE — Telephone Encounter (Signed)
You would need his records from previous doctors--- I would think--- you could not make that decision on 1 visit

## 2018-08-28 NOTE — Telephone Encounter (Addendum)
Would you mind helping me out and call patient explaining to him that I am not able to fill out his discharge application for total and permament disability. I have only met him one time and don't know his medical history well enough. Reviewed and got opinion from both supervising physicians and they are in agreement. So I noticed he has appointment with me on August 31, 2018. I want him to be aware of this before he comes in as I don't want him to feel frustrated as the appointment would not be beneficial if that is his purpose for appointment.  I thinks he needs to have his form primary care provider(Medical Doctor that does disability) to  fill this out as history would be known and could give opinion.  I just got this in my folder last Thursday Sept 26,2019 and just now getting to address.  Let me know what he says.  Would like to make him aware by tomorrow. Let me now what he says.

## 2018-08-28 NOTE — Telephone Encounter (Signed)
Could you see note I sent to Raquel Sarna and Ivin Booty. Maybe you could call patient if they have not call him by tomorrow afternoon.   I think his appointment might need to be canceled on Thursday if the disability form is the reason for the appointment.

## 2018-08-29 NOTE — Telephone Encounter (Signed)
Author phoned pt. To relay Tyler Maynard's message. No answer, author left detailed VM asking for return call.

## 2018-08-29 NOTE — Telephone Encounter (Signed)
This is the patient that I had talked with you about making sure he understands before Thursday that I can't fill out his disability paperwork. I want him to be aware of this in event he wants to cancel his appointment if that was the sole reason for his visit.

## 2018-08-29 NOTE — Telephone Encounter (Signed)
Author reached out to pt., left detailed VM with [name]'s suggestion to see prior PCP for ppwk. [name] already made aware.

## 2018-08-30 NOTE — Telephone Encounter (Signed)
LVM for patient to call back regarding appt & confirm what it is he needs to be seen for.

## 2018-08-31 ENCOUNTER — Ambulatory Visit (INDEPENDENT_AMBULATORY_CARE_PROVIDER_SITE_OTHER): Payer: Medicare Other | Admitting: Medical

## 2018-08-31 ENCOUNTER — Encounter: Payer: Self-pay | Admitting: Medical

## 2018-08-31 VITALS — BP 129/83 | HR 85 | Temp 98.4°F | Resp 16 | Ht 72.0 in | Wt 319.8 lb

## 2018-08-31 DIAGNOSIS — E119 Type 2 diabetes mellitus without complications: Secondary | ICD-10-CM

## 2018-08-31 DIAGNOSIS — L603 Nail dystrophy: Secondary | ICD-10-CM

## 2018-08-31 DIAGNOSIS — R739 Hyperglycemia, unspecified: Secondary | ICD-10-CM

## 2018-08-31 DIAGNOSIS — M25571 Pain in right ankle and joints of right foot: Secondary | ICD-10-CM | POA: Diagnosis not present

## 2018-08-31 DIAGNOSIS — I10 Essential (primary) hypertension: Secondary | ICD-10-CM | POA: Diagnosis not present

## 2018-08-31 DIAGNOSIS — E785 Hyperlipidemia, unspecified: Secondary | ICD-10-CM | POA: Diagnosis not present

## 2018-08-31 NOTE — Progress Notes (Signed)
Subjective:    Patient ID: Tyler Maynard, male    DOB: [birth year], 67 y.o.   MRN: [id number]  HPI  Pt in for follow up.   Pt had a1c in diabetic range. I sent in metformin to pharmacy.No side effects with metformin.  Also recent mild high hdl. Recommended continue lipitor.  Mild liver enzymes elevation and recommended cut back on sugar.  Pt also had disability form for me to fill out. Got that late last week. I only saw him one time in past. Reviewed and did not feel I know him well enough to attest to his disability. Also form purpose appears to be  to release him from loan obligation. Discussed with supervising MD [place] and Dr. Charlett Blake and they both agreed not to fill. To let former MD or MD who deals with disability. Dr. Charlett Blake felt it would be different if I had know him for years.  Pt updates me history on form details.    Review of Systems  Constitutional: Negative for chills, fatigue and fever.  Respiratory: Negative for cough, chest tightness, shortness of breath and wheezing.   Cardiovascular: Negative for chest pain and palpitations.  Gastrointestinal: Negative for abdominal pain.  Musculoskeletal: Negative for back pain.       Rt ankle pain. Medial aspect   Skin: Negative for rash.  Neurological: Negative for dizziness, syncope, speech difficulty, weakness, light-headedness and headaches.  Hematological: Negative for adenopathy. Does not bruise/bleed easily.  Psychiatric/Behavioral: Negative for behavioral problems, confusion, self-injury and sleep disturbance. The patient is not nervous/anxious.     Past Medical History:  Diagnosis Date  . Arthritis   . History of kidney stones   . Hyperlipidemia   . Hypertension   . Sleep apnea    borderline-lost weight     Social History   Socioeconomic History  . Marital status: Widowed    Spouse name: Not on file  . Number of children: Not on file  . Years of education: Not on file  . Highest education level: Not on  file  Occupational History  . Not on file  Social Needs  . Financial resource strain: Not on file  . Food insecurity:    Worry: Not on file    Inability: Not on file  . Transportation needs:    Medical: Not on file    Non-medical: Not on file  Tobacco Use  . Smoking status: Former Smoker    Packs/day: 1.00    Years: 10.00    Pack years: 10.00    Types: Cigarettes    Last attempt to quit: 06/13/1995    Years since quitting: 23.2  . Smokeless tobacco: Never Used  Substance and Sexual Activity  . Alcohol use: Yes    Comment: social  . Drug use: No  . Sexual activity: Not Currently  Lifestyle  . Physical activity:    Days per week: Not on file    Minutes per session: Not on file  . Stress: Not on file  Relationships  . Social connections:    Talks on phone: Not on file    Gets together: Not on file    Attends religious service: Not on file    Active member of club or organization: Not on file    Attends meetings of clubs or organizations: Not on file    Relationship status: Not on file  . Intimate partner violence:    Fear of current or ex partner: Not on file  Emotionally abused: Not on file    Physically abused: Not on file    Forced sexual activity: Not on file  Other Topics Concern  . Not on file  Social History Narrative  . Not on file    Past Surgical History:  Procedure Laterality Date  . 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 TOTAL KNEE ARTHROPLASTY Right 11/10/2015   Procedure: TOTAL KNEE ARTHROPLASTY;  Surgeon: Frederik Pear, MD;  Location: [facility];  Service: Orthopedics;  Laterality: Right;    No family history on file.  No Known Allergies  Current Outpatient Medications on File Prior to Visit  Medication Sig  Dispense Refill  . aspirin EC 325 MG tablet Take 1 tablet (325 mg total) by mouth 2 (two) times daily. 30 tablet 0  . atorvastatin (LIPITOR) 20 MG tablet Take 1 tablet (20 mg total) by mouth daily. 90 tablet 1  . lisinopril-hydrochlorothiazide (PRINZIDE,ZESTORETIC) 20-25 MG per tablet Take 1 tablet by mouth daily.  1  . metFORMIN (GLUCOPHAGE-XR) 500 MG 24 hr tablet Take 1 tablet (500 mg total) by mouth daily with breakfast. (Patient not taking: Reported on 08/31/2018) 90 tablet 1  . testosterone cypionate (DEPOTESTOSTERONE CYPIONATE) 200 MG/ML injection Inject 200 mg into the muscle every 28 (twenty-eight) days.  0  . Vitamin D, Cholecalciferol, 1000 UNITS CAPS Take 1,000 Units by mouth daily.     No current facility-administered medications on file prior to visit.     BP 129/83   Pulse 85   Temp 98.4 F (36.9 C) (Oral)   Resp 16   Ht 6' (1.829 m)   Wt (!) 319 lb 12.8 oz (145.1 kg)   SpO2 96%   BMI 43.37 kg/m       Objective:   Physical Exam  General Mental Status- Alert. General Appearance- Not in acute distress.   Skin General: Color- Normal Color. Moisture- Normal Moisture.  Neck Carotid Arteries- Normal color. Moisture- Normal Moisture. No carotid bruits. No JVD.  Chest and Lung Exam Auscultation: Breath Sounds:-Normal.  Cardiovascular Auscultation:Rythm- Regular. Murmurs & Other Heart Sounds:Auscultation of the heart reveals- No Murmurs.  Abdomen Inspection:-Inspeection Normal. Palpation/Percussion:Note:No mass. Palpation and Percussion of the abdomen reveal- Non Tender, Non Distended + BS, no rebound or guarding.  Neurologic Cranial Nerve exam:- CN III-XII intact(No nystagmus), symmetric smile. Strength:- 5/5 equal and symmetric strength both upper and lower extremities.  Lower ext- thick, dystrophic nails.   Rt ankle- medial aspect pain.mild swelling.      Assessment & Plan:  For high cholesterol, continue with atorvastatin and low cholesterol  diet.  For diabetes, eat low sugar diet, exercise and continue metformin.  For htn continue lisinopril/hctz.  Will put in future labs for you to get first week of November.   For high cholesterol, continue with atorvastatin and low cholesterol diet.  For diabetes, eat low sugar diet, exercise and continue metformin.  For htn continue lisinopril/hctz.  Will put in future labs for you to get first week of November.   Do recommend cutting back on alcohol. This will help you lose weight and help control blood sugar.  Follow up date to be determined after that lab review.  Refer to podiatrist for nail evaluation/trimming.   Mackie Pai, PA-C

## 2018-08-31 NOTE — Patient Instructions (Addendum)
For high cholesterol, continue with atorvastatin and low cholesterol diet.  For diabetes, eat low sugar diet, exercise and continue metformin.  For htn continue lisinopril/hctz.  Will put in future labs for you to get first week of November.   Do recommend cutting back on alcohol. This will help you lose weight and help control blood sugar.  Follow up date to be determined after that lab review.  Refer to podiatrist for nail evaluation/trimming and ankle.

## 2018-10-03 ENCOUNTER — Telehealth: Payer: Self-pay | Admitting: Medical

## 2018-10-03 ENCOUNTER — Other Ambulatory Visit (INDEPENDENT_AMBULATORY_CARE_PROVIDER_SITE_OTHER): Payer: Medicare Other

## 2018-10-03 DIAGNOSIS — E119 Type 2 diabetes mellitus without complications: Secondary | ICD-10-CM

## 2018-10-03 DIAGNOSIS — E785 Hyperlipidemia, unspecified: Secondary | ICD-10-CM | POA: Diagnosis not present

## 2018-10-03 LAB — COMPREHENSIVE METABOLIC PANEL
ALBUMIN: 4.1 g/dL (ref 3.5–5.2)
ALT: 43 U/L (ref 0–53)
AST: 41 U/L — ABNORMAL HIGH (ref 0–37)
Alkaline Phosphatase: 89 U/L (ref 39–117)
BUN: 15 mg/dL (ref 6–23)
CALCIUM: 9.7 mg/dL (ref 8.4–10.5)
CO2: 29 mEq/L (ref 19–32)
Chloride: 100 mEq/L (ref 96–112)
Creatinine, Ser: 0.89 mg/dL (ref 0.40–1.50)
GFR: 109.99 mL/min (ref >60.00)
Glucose, Bld: 113 mg/dL — ABNORMAL HIGH (ref 70–99)
POTASSIUM: 4.2 meq/L (ref 3.5–5.1)
Sodium: 136 mEq/L (ref 135–145)
Total Bilirubin: 0.7 mg/dL (ref 0.2–1.2)
Total Protein: 6.8 g/dL (ref 6.0–8.3)

## 2018-10-03 LAB — LIPID PANEL
CHOL/HDL RATIO: 5
CHOLESTEROL: 154 mg/dL (ref 0–200)
HDL: 28.9 mg/dL — AB (ref >39.00)
NonHDL: 124.76
TRIGLYCERIDES: 215 mg/dL — AB (ref 0.0–149.0)
VLDL: 43 mg/dL — AB (ref 0.0–40.0)

## 2018-10-03 LAB — LDL CHOLESTEROL, DIRECT: LDL DIRECT: 88 mg/dL

## 2018-10-03 LAB — HEMOGLOBIN A1C: Hgb A1c MFr Bld: 6.6 % — ABNORMAL HIGH (ref 4.6–6.5)

## 2018-10-03 MED ORDER — FENOFIBRATE 48 MG PO TABS: 48.0000 mg | ORAL_TABLET | Freq: Every day | ORAL | 3 refills | Status: DC

## 2018-10-03 NOTE — Telephone Encounter (Signed)
Rx fenofibrate sent to pt pharmacy.

## 2018-10-12 ENCOUNTER — Telehealth: Payer: Self-pay | Admitting: Medical

## 2018-10-12 NOTE — Telephone Encounter (Signed)
Pt called in and stated he was un ware as to why this med was sent in?  He stated he was not informed of his lab results on 11/5.   Best number [phone]

## 2018-10-12 NOTE — Telephone Encounter (Signed)
Copied from [place] [id number]. Topic: Quick Communication - See Telephone Encounter >> Oct 12, 2018  3:37 PM Rutherford Nail, [place] wrote: CRM for notification. See Telephone encounter for: 10/12/18. Patient calling for lab results from 10/03/18. Please advise.

## 2018-10-12 NOTE — Telephone Encounter (Signed)
Please call pt and ask him to follow up. The lab result that was released. Was that a my chart message. You mentioned that he was upset? Did he review message?? Please let him know would like for him to come in office for appointment to discuss the cardiovascular risk. Try to schedule him early morning or early afternoon appointment. If he refused appointment please let me know.

## 2018-10-12 NOTE — Telephone Encounter (Signed)
Pt. called, wondering about why pharmacy was contacting him about the fenofibrate, given that he had never heard about his test results, let alone the med. Pt. Obviously upset, but author explained edward's note from 11/5 in detail, and pt. verbalized understanding. Pt. stated he cannot afford the fenofibrate though (about $100 OOP), so Pryor Curia stated she would consult with PCP to suggest alternative. Author reassured pt. that PCP or CMA would reach out to him tomorrow with next steps.

## 2018-10-13 NOTE — Telephone Encounter (Signed)
Spoke with Pt and setup pt an appt on 10-16-2018 at 3:00. Done

## 2018-10-16 ENCOUNTER — Telehealth: Payer: Self-pay | Admitting: Medical

## 2018-10-16 ENCOUNTER — Ambulatory Visit (INDEPENDENT_AMBULATORY_CARE_PROVIDER_SITE_OTHER): Payer: Medicare Other | Admitting: Medical

## 2018-10-16 ENCOUNTER — Encounter: Payer: Self-pay | Admitting: Medical

## 2018-10-16 VITALS — BP 139/80 | HR 75 | Temp 98.3°F | Resp 16 | Ht 72.0 in | Wt 329.2 lb

## 2018-10-16 DIAGNOSIS — R252 Cramp and spasm: Secondary | ICD-10-CM

## 2018-10-16 DIAGNOSIS — M79605 Pain in left leg: Secondary | ICD-10-CM | POA: Diagnosis not present

## 2018-10-16 DIAGNOSIS — R7989 Other specified abnormal findings of blood chemistry: Secondary | ICD-10-CM

## 2018-10-16 DIAGNOSIS — E785 Hyperlipidemia, unspecified: Secondary | ICD-10-CM | POA: Diagnosis not present

## 2018-10-16 LAB — D-DIMER, QUANTITATIVE: D-Dimer, Quant: 0.56 mcg/mL FEU — ABNORMAL HIGH (ref <0.50)

## 2018-10-16 MED ORDER — VITAMIN D (CHOLECALCIFEROL) 25 MCG (1000 UT) PO CAPS: 1000.0000 [IU] | ORAL_CAPSULE | Freq: Every day | ORAL | Status: AC

## 2018-10-16 MED ORDER — FENOFIBRATE 54 MG PO TABS: 54.0000 mg | ORAL_TABLET | Freq: Every day | ORAL | 3 refills | Status: DC

## 2018-10-16 NOTE — Telephone Encounter (Signed)
Opened to review 
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
## 2018-10-16 NOTE — Telephone Encounter (Signed)
We need to get ultrasound lower extremity stat morinng of Nov 19,2019. Does he need authorization.
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
## 2018-10-17 ENCOUNTER — Encounter (HOSPITAL_BASED_OUTPATIENT_CLINIC_OR_DEPARTMENT_OTHER): Payer: Self-pay

## 2018-10-17 ENCOUNTER — Ambulatory Visit (HOSPITAL_BASED_OUTPATIENT_CLINIC_OR_DEPARTMENT_OTHER)
Admission: RE | Admit: 2018-10-17 | Discharge: 2018-10-17 | Disposition: A | Discharge status: Home / Self Care | Payer: Medicare Other | Source: Ambulatory Visit | Attending: Medical | Admitting: Medical

## 2018-10-17 DIAGNOSIS — R7989 Other specified abnormal findings of blood chemistry: Secondary | ICD-10-CM | POA: Insufficient documentation

## 2018-10-17 DIAGNOSIS — R252 Cramp and spasm: Secondary | ICD-10-CM | POA: Diagnosis not present

## 2018-10-17 HISTORY — DX: Personal history of other venous thrombosis and embolism: Z86.718

## 2018-10-17 HISTORY — DX: Personal history of pulmonary embolism: Z86.711

## 2018-10-17 LAB — COMPREHENSIVE METABOLIC PANEL
ALBUMIN: 4.1 g/dL (ref 3.5–5.2)
ALK PHOS: 95 U/L (ref 39–117)
ALT: 33 U/L (ref 0–53)
AST: 33 U/L (ref 0–37)
BILIRUBIN TOTAL: 0.8 mg/dL (ref 0.2–1.2)
BUN: 18 mg/dL (ref 6–23)
CALCIUM: 9.9 mg/dL (ref 8.4–10.5)
CHLORIDE: 100 meq/L (ref 96–112)
CO2: 31 mEq/L (ref 19–32)
CREATININE: 0.92 mg/dL (ref 0.40–1.50)
GFR: 105.85 mL/min (ref >60.00)
Glucose, Bld: 92 mg/dL (ref 70–99)
Potassium: 4.2 mEq/L (ref 3.5–5.1)
SODIUM: 139 meq/L (ref 135–145)
Total Protein: 7.1 g/dL (ref 6.0–8.3)

## 2018-10-17 LAB — MAGNESIUM: MAGNESIUM: 1.9 mg/dL (ref 1.5–2.5)

## 2018-11-28 ENCOUNTER — Other Ambulatory Visit: Payer: Self-pay | Admitting: Medical

## 2018-11-28 NOTE — Telephone Encounter (Signed)
Please advise 

## 2018-11-28 NOTE — Telephone Encounter (Signed)
Pt states that a few months ago he was told to take 2 per day and he has been doing that but the rx was not changed and he ran out early. Please advise.

## 2018-11-29 MED ORDER — ATORVASTATIN CALCIUM 40 MG PO TABS: 40.0000 mg | ORAL_TABLET | Freq: Every day | ORAL | 0 refills | Status: DC

## 2018-11-29 NOTE — Telephone Encounter (Signed)
rx atorvastatin higher dose sent to pt pharmacy. Only take one tab a day. Follow 3 months from last appointment. Please get him scheduled. Advise come in fasting so we can repeat lipid panel.

## 2018-11-30 NOTE — Telephone Encounter (Signed)
Notified pt. 

## 2018-12-12 ENCOUNTER — Other Ambulatory Visit: Payer: Self-pay | Admitting: Medical

## 2019-01-17 ENCOUNTER — Ambulatory Visit (INDEPENDENT_AMBULATORY_CARE_PROVIDER_SITE_OTHER): Payer: Medicare Other | Admitting: Medical

## 2019-01-17 ENCOUNTER — Encounter: Payer: Self-pay | Admitting: Medical

## 2019-01-17 ENCOUNTER — Ambulatory Visit (HOSPITAL_BASED_OUTPATIENT_CLINIC_OR_DEPARTMENT_OTHER)
Admission: RE | Admit: 2019-01-17 | Discharge: 2019-01-17 | Disposition: A | Discharge status: Home / Self Care | Payer: Medicare Other | Source: Ambulatory Visit | Attending: Medical | Admitting: Medical

## 2019-01-17 VITALS — BP 121/85 | HR 78 | Temp 98.1°F | Resp 16 | Ht 72.0 in | Wt 324.0 lb

## 2019-01-17 DIAGNOSIS — M545 Low back pain, unspecified: Secondary | ICD-10-CM

## 2019-01-17 DIAGNOSIS — E119 Type 2 diabetes mellitus without complications: Secondary | ICD-10-CM

## 2019-01-17 DIAGNOSIS — E785 Hyperlipidemia, unspecified: Secondary | ICD-10-CM | POA: Diagnosis not present

## 2019-01-17 DIAGNOSIS — I10 Essential (primary) hypertension: Secondary | ICD-10-CM | POA: Diagnosis not present

## 2019-01-17 LAB — COMPREHENSIVE METABOLIC PANEL
ALBUMIN: 3.9 g/dL (ref 3.5–5.2)
ALK PHOS: 65 U/L (ref 39–117)
ALT: 22 U/L (ref 0–53)
AST: 24 U/L (ref 0–37)
BILIRUBIN TOTAL: 1.1 mg/dL (ref 0.2–1.2)
BUN: 16 mg/dL (ref 6–23)
CALCIUM: 9.5 mg/dL (ref 8.4–10.5)
CO2: 28 meq/L (ref 19–32)
CREATININE: 1.07 mg/dL (ref 0.40–1.50)
Chloride: 101 mEq/L (ref 96–112)
GFR: 83.6 mL/min (ref >60.00)
Glucose, Bld: 113 mg/dL — ABNORMAL HIGH (ref 70–99)
Potassium: 4 mEq/L (ref 3.5–5.1)
Sodium: 138 mEq/L (ref 135–145)
TOTAL PROTEIN: 6.7 g/dL (ref 6.0–8.3)

## 2019-01-17 LAB — LIPID PANEL
CHOL/HDL RATIO: 5
Cholesterol: 141 mg/dL (ref 0–200)
HDL: 26.6 mg/dL — ABNORMAL LOW (ref >39.00)
LDL Cholesterol: 85 mg/dL (ref 0–99)
NonHDL: 114.02
TRIGLYCERIDES: 147 mg/dL (ref 0.0–149.0)
VLDL: 29.4 mg/dL (ref 0.0–40.0)

## 2019-01-17 LAB — HEMOGLOBIN A1C: Hgb A1c MFr Bld: 6.8 % — ABNORMAL HIGH (ref 4.6–6.5)

## 2019-01-17 MED ORDER — CYCLOBENZAPRINE HCL 5 MG PO TABS
5.0000 mg | ORAL_TABLET | Freq: Every day | ORAL | 0 refills | Status: DC
Start: 2019-01-17 — End: 2019-04-30

## 2019-01-17 NOTE — Patient Instructions (Addendum)
For high cholesterol htn, and diabetes will get cmp, lipid panel and a1c today.  Continue current meds. May adjust med depending on lab results.  For back pain will get lumbar xray. Can use low dose tylenol. Will give low dose flexeril 5 mg over next 5-7 days.  Back exercises/stretching.   Counseled pt on getting screening for colon deferred presently. If you change you mind just let me know.  Follow up in 3 months or as needed   Back Exercises If you have pain in your back, do these exercises 2-3 times each day or as told by your doctor. When the pain goes away, do the exercises once each day, but repeat the steps more times for each exercise (do more repetitions). If you do not have pain in your back, do these exercises once each day or as told by your doctor. Exercises Single Knee to Chest Do these steps 3-5 times in a row for each leg: 1. Lie on your back on a firm bed or the floor with your legs stretched out. 2. Bring one knee to your chest. 3. Hold your knee to your chest by grabbing your knee or thigh. 4. Pull on your knee until you feel a gentle stretch in your lower back. 5. Keep doing the stretch for 10-30 seconds. 6. Slowly let go of your leg and straighten it. Pelvic Tilt Do these steps 5-10 times in a row: 1. Lie on your back on a firm bed or the floor with your legs stretched out. 2. Bend your knees so they point up to the ceiling. Your feet should be flat on the floor. 3. Tighten your lower belly (abdomen) muscles to press your lower back against the floor. This will make your tailbone point up to the ceiling instead of pointing down to your feet or the floor. 4. Stay in this position for 5-10 seconds while you gently tighten your muscles and breathe evenly. Cat-Cow Do these steps until your lower back bends more easily: 1. Get on your hands and knees on a firm surface. Keep your hands under your shoulders, and keep your knees under your hips. You may put padding under  your knees. 2. Let your head hang down, and make your tailbone point down to the floor so your lower back is round like the back of a cat. 3. Stay in this position for 5 seconds. 4. Slowly lift your head and make your tailbone point up to the ceiling so your back hangs low (sags) like the back of a cow. 5. Stay in this position for 5 seconds.  Press-Ups Do these steps 5-10 times in a row: 1. Lie on your belly (face-down) on the floor. 2. Place your hands near your head, about shoulder-width apart. 3. While you keep your back relaxed and keep your hips on the floor, slowly straighten your arms to raise the top half of your body and lift your shoulders. Do not use your back muscles. To make yourself more comfortable, you may change where you place your hands. 4. Stay in this position for 5 seconds. 5. Slowly return to lying flat on the floor.  Bridges Do these steps 10 times in a row: 1. Lie on your back on a firm surface. 2. Bend your knees so they point up to the ceiling. Your feet should be flat on the floor. 3. Tighten your butt muscles and lift your butt off of the floor until your waist is almost as high as your  knees. If you do not feel the muscles working in your butt and the back of your thighs, slide your feet 1-2 inches farther away from your butt. 4. Stay in this position for 3-5 seconds. 5. Slowly lower your butt to the floor, and let your butt muscles relax. If this exercise is too easy, try doing it with your arms crossed over your chest. Belly Crunches Do these steps 5-10 times in a row: 1. Lie on your back on a firm bed or the floor with your legs stretched out. 2. Bend your knees so they point up to the ceiling. Your feet should be flat on the floor. 3. Cross your arms over your chest. 4. Tip your chin a little bit toward your chest but do not bend your neck. 5. Tighten your belly muscles and slowly raise your chest just enough to lift your shoulder blades a tiny bit off  of the floor. 6. Slowly lower your chest and your head to the floor. Back Lifts Do these steps 5-10 times in a row: 1. Lie on your belly (face-down) with your arms at your sides, and rest your forehead on the floor. 2. Tighten the muscles in your legs and your butt. 3. Slowly lift your chest off of the floor while you keep your hips on the floor. Keep the back of your head in line with the curve in your back. Look at the floor while you do this. 4. Stay in this position for 3-5 seconds. 5. Slowly lower your chest and your face to the floor. Contact a doctor if:  Your back pain gets a lot worse when you do an exercise.  Your back pain does not lessen 2 hours after you exercise. If you have any of these problems, stop doing the exercises. Do not do them again unless your doctor says it is okay. Get help right away if:  You have sudden, very bad back pain. If this happens, stop doing the exercises. Do not do them again unless your doctor says it is okay. This information is not intended to replace advice given to you by your health care provider. Make sure you discuss any questions you have with your health care provider. Document Released: 12/18/2010 Document Revised: 08/09/2018 Document Reviewed: 01/09/2015 Elsevier Interactive Patient Education  [employer].
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
## 2019-02-13 ENCOUNTER — Other Ambulatory Visit: Payer: Self-pay | Admitting: Medical

## 2019-02-16 ENCOUNTER — Telehealth: Payer: Self-pay | Admitting: Medical

## 2019-02-16 MED ORDER — MONTELUKAST SODIUM 10 MG PO TABS
10.0000 mg | ORAL_TABLET | Freq: Every day | ORAL | 3 refills | Status: DC
Start: 2019-02-16 — End: 2019-06-21

## 2019-02-16 NOTE — Telephone Encounter (Signed)
Montelukast sent to pt pharmacy.

## 2019-02-16 NOTE — Telephone Encounter (Signed)
Copied from [place] [id number]. Topic: General - Other >> Feb 16, 2019 11:54 AM Lennox Solders wrote: Reason for CRM:pt is calling and needs new rx montelukast. This med was last prescribed by his old md. Pt is having allergic flare up. [facility] [address] Pt last seen edward 01-17-2019

## 2019-02-16 NOTE — Telephone Encounter (Signed)
Not on med list. Please advise.

## 2019-03-25 ENCOUNTER — Other Ambulatory Visit: Payer: Self-pay | Admitting: Medical

## 2019-04-17 ENCOUNTER — Encounter: Payer: Self-pay | Admitting: Medical

## 2019-04-17 ENCOUNTER — Ambulatory Visit (INDEPENDENT_AMBULATORY_CARE_PROVIDER_SITE_OTHER): Payer: Medicare Other | Admitting: Medical

## 2019-04-17 ENCOUNTER — Other Ambulatory Visit: Payer: Self-pay

## 2019-04-17 ENCOUNTER — Ambulatory Visit: Payer: Medicare Other | Admitting: Medical

## 2019-04-17 VITALS — BP 132/86 | Ht 72.0 in | Wt 314.0 lb

## 2019-04-17 DIAGNOSIS — E119 Type 2 diabetes mellitus without complications: Secondary | ICD-10-CM

## 2019-04-17 DIAGNOSIS — E785 Hyperlipidemia, unspecified: Secondary | ICD-10-CM

## 2019-04-17 DIAGNOSIS — I10 Essential (primary) hypertension: Secondary | ICD-10-CM | POA: Diagnosis not present

## 2019-04-17 NOTE — Patient Instructions (Signed)
Bp recently well controlled. Continue current med.  For diabetes, continue low sugar and metformin.  For high cholesterol continue atorvastatin. Will get lab within next.  Cmp, lipid, and A1c next week fasting  Follow up date to be determined.
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
## 2019-04-27 ENCOUNTER — Other Ambulatory Visit (INDEPENDENT_AMBULATORY_CARE_PROVIDER_SITE_OTHER): Payer: Medicare Other

## 2019-04-27 ENCOUNTER — Other Ambulatory Visit: Payer: Self-pay

## 2019-04-27 DIAGNOSIS — E785 Hyperlipidemia, unspecified: Secondary | ICD-10-CM | POA: Diagnosis not present

## 2019-04-27 DIAGNOSIS — E119 Type 2 diabetes mellitus without complications: Secondary | ICD-10-CM | POA: Diagnosis not present

## 2019-04-27 DIAGNOSIS — I10 Essential (primary) hypertension: Secondary | ICD-10-CM | POA: Diagnosis not present

## 2019-04-27 LAB — LIPID PANEL
Cholesterol: 129 mg/dL (ref 0–200)
HDL: 25.8 mg/dL — ABNORMAL LOW (ref >39.00)
LDL Cholesterol: 80 mg/dL (ref 0–99)
NonHDL: 103.39
Total CHOL/HDL Ratio: 5
Triglycerides: 118 mg/dL (ref 0.0–149.0)
VLDL: 23.6 mg/dL (ref 0.0–40.0)

## 2019-04-27 LAB — COMPREHENSIVE METABOLIC PANEL
ALT: 22 U/L (ref 0–53)
AST: 23 U/L (ref 0–37)
Albumin: 4 g/dL (ref 3.5–5.2)
Alkaline Phosphatase: 71 U/L (ref 39–117)
BUN: 19 mg/dL (ref 6–23)
CO2: 28 mEq/L (ref 19–32)
Calcium: 9.8 mg/dL (ref 8.4–10.5)
Chloride: 98 mEq/L (ref 96–112)
Creatinine, Ser: 1.12 mg/dL (ref 0.40–1.50)
GFR: 79.24 mL/min (ref >60.00)
Glucose, Bld: 95 mg/dL (ref 70–99)
Potassium: 4.1 mEq/L (ref 3.5–5.1)
Sodium: 137 mEq/L (ref 135–145)
Total Bilirubin: 0.9 mg/dL (ref 0.2–1.2)
Total Protein: 7.2 g/dL (ref 6.0–8.3)

## 2019-04-27 LAB — HEMOGLOBIN A1C: Hgb A1c MFr Bld: 6.7 % — ABNORMAL HIGH (ref 4.6–6.5)

## 2019-04-30 ENCOUNTER — Ambulatory Visit (INDEPENDENT_AMBULATORY_CARE_PROVIDER_SITE_OTHER): Payer: Medicare Other | Admitting: Medical

## 2019-04-30 ENCOUNTER — Telehealth: Payer: Self-pay | Admitting: Medical

## 2019-04-30 ENCOUNTER — Other Ambulatory Visit: Payer: Self-pay

## 2019-04-30 DIAGNOSIS — M545 Low back pain, unspecified: Secondary | ICD-10-CM

## 2019-04-30 MED ORDER — CYCLOBENZAPRINE HCL 5 MG PO TABS: 5.0000 mg | ORAL_TABLET | Freq: Every day | ORAL | 0 refills | Status: DC

## 2019-04-30 MED ORDER — TRAMADOL HCL 50 MG PO TABS: 50.0000 mg | ORAL_TABLET | Freq: Four times a day (QID) | ORAL | 0 refills | Status: AC | PRN

## 2019-04-30 NOTE — Patient Instructions (Addendum)
For back pain, I asked him to get his bp checked at pharmacy. If bp reasonable then can rx nsaid.(he will call back with reading) Went ahead and rx'd flexeril and tramadol. Rx advisement given.  Advised if pain worsens or changes let us know. Reviewed old xray with him done in February. Red flags review with him regarding symptoms that would indicate ED evaluation. Including any constant testicle pain.   Follow up in one week or as needed.

## 2019-04-30 NOTE — Progress Notes (Addendum)
   Subjective:    Patient ID: Tyler Maynard, male    DOB: 12/31/1950, 68 y.o.   MRN: [id number]  HPI  Virtual Visit via Telephone Note  I connected with Tyler Maynard on 04/30/19 at  1:40 PM EDT by telephone and verified that I am speaking with the correct person using two identifiers.  Location: Patient: home Provider: office  Pt states no bp to check his cuff. But he has one on order. Expects will have one come in soon.   I discussed the limitations, risks, security and privacy concerns of performing an evaluation and management service by telephone and the availability of in person appointments. I also discussed with the patient that there may be a patient responsible charge related to this service. The patient expressed understanding and agreed to proceed.   History of Present Illness:  Pt states lower back, upper buttock pain that started on Thursday evening. Friday morning felt stiff and mild pain. Pt states if sitting still has no pain. Pain hurts mostly on transitioning to standing after sitting. After moving little bit pain will ease with motion but then will gradually ease up. Some pain in back seems to radiate toward inner thigh and toward left testicle. But no direct testicle pain. No leg weakness or radiating pain toward feet. No incontinence.  Pt xray in 12-2018  IMPRESSION: Mild scattered degenerative disc disease changes of the lumbar spine without acute abnormalities.  Suspicion of at least unilateral spondylolysis at L5 without significant spondylolisthesis.    Observations/Objective:  General- pleasant, oriented. Speech is normal. Non labored speech.    Assessment and Plan: For back pain, I asked him to get his bp checked at pharmacy. If bp reasonable then can rx nsaid.(he will call back with reading) Went ahead and rx'd flexeril and tramadol. Rx advisement given.  Advised if pain worsens or changes let us know. Reviewed old xray with him done in  February. Red flags review with him regarding symptoms that would indicate ED evaluation. Including any constant testicle pain.   Follow up in one week or as needed.   15 minutes spent with pt today.  Follow Up Instructions:    I discussed the assessment and treatment plan with the patient. The patient was provided an opportunity to ask questions and all were answered. The patient agreed with the plan and demonstrated an understanding of the instructions.   The patient was advised to call back or seek an in-person evaluation if the symptoms worsen or if the condition fails to improve as anticipated.     Mackie Pai, PA-C     Review of Systems  Constitutional: Negative for chills, fatigue and fever.  Cardiovascular: Negative for chest pain and palpitations.  Gastrointestinal: Negative for abdominal pain.  Genitourinary: Negative for decreased urine volume, discharge, dysuria, flank pain, frequency, penile pain and scrotal swelling.       See hpi.  Musculoskeletal: Positive for back pain.  Skin: Negative for rash.  Neurological: Negative for facial asymmetry.  Hematological: Negative for adenopathy. Does not bruise/bleed easily.  Psychiatric/Behavioral: Negative for behavioral problems. The patient is not nervous/anxious.        Objective:   Physical Exam        Assessment & Plan:

## 2019-04-30 NOTE — Telephone Encounter (Signed)
lvm for pt to call back and schedule follow up

## 2019-04-30 NOTE — Telephone Encounter (Unsigned)
Copied from [place] [id number]. Topic: General - Other >> Apr 30, 2019  4:50 PM Marin Olp L wrote: Reason for CRM: Patient calling to notify PA Saguier that he was not able to check his blood pressure after his appt today because the public machines were closed off due to [place]. Please advise.

## 2019-05-01 NOTE — Telephone Encounter (Signed)
Ok.. So he needs to get over the counter electronic bp cuff.
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
## 2019-05-09 ENCOUNTER — Ambulatory Visit (INDEPENDENT_AMBULATORY_CARE_PROVIDER_SITE_OTHER): Payer: Medicare Other | Admitting: Medical

## 2019-05-09 ENCOUNTER — Encounter: Payer: Self-pay | Admitting: Medical

## 2019-05-09 ENCOUNTER — Other Ambulatory Visit: Payer: Self-pay

## 2019-05-09 VITALS — BP 127/80 | HR 83 | Temp 98.5°F | Resp 16 | Ht 72.0 in | Wt 315.6 lb

## 2019-05-09 DIAGNOSIS — M545 Low back pain, unspecified: Secondary | ICD-10-CM

## 2019-05-09 MED ORDER — CYCLOBENZAPRINE HCL 5 MG PO TABS: 5.0000 mg | ORAL_TABLET | Freq: Every day | ORAL | 0 refills | Status: DC

## 2019-05-09 NOTE — Patient Instructions (Addendum)
Pt has more back pain now for 2 weeks with some radiating pain sciatica pattern but also some pain radiating toward upper thigh and scrotum. No hernia on exam.  Use your meloxicam, refill flexril and advice otc lidocaine patch.  Will refer to sport medicine. Pt may need mri of lower back.  Follow up late summer or as needed

## 2019-05-15 ENCOUNTER — Encounter: Payer: Self-pay | Admitting: Family Medicine

## 2019-05-15 ENCOUNTER — Ambulatory Visit (INDEPENDENT_AMBULATORY_CARE_PROVIDER_SITE_OTHER): Payer: Medicare Other | Admitting: Family Medicine

## 2019-05-15 ENCOUNTER — Other Ambulatory Visit: Payer: Self-pay

## 2019-05-15 DIAGNOSIS — M545 Low back pain, unspecified: Secondary | ICD-10-CM
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
## 2019-05-15 NOTE — Patient Instructions (Signed)
Nice to meet you Please try the range of motion exercises in the beginning then progress to the strengthening  Heat will work better on this area.  Please try a lifting belt if you are doing a lot of lifting.  You could try Voltaren to rub onto the area which is over-the-counter. You can try massage Please send me a message in MyChart with any questions or updates.  Please see me back in 4 weeks.   --Dr. Raeford Razor

## 2019-05-15 NOTE — Assessment & Plan Note (Signed)
Back pain likely related to the facet degenerative change as well as the spondylosis in the lower spine.  Not suggestive of nerve impingement.  No history of nephrolithiasis.  Has had improvement since taking the medications.  Has been lifting more so could be attributed to that. -Counseled on home exercise therapy and supportive care. -Can continue the previously prescribed medications. -If no improvement may need to consider physical therapy

## 2019-05-21 ENCOUNTER — Other Ambulatory Visit: Payer: Self-pay | Admitting: Medical

## 2019-06-02 ENCOUNTER — Telehealth: Payer: Self-pay | Admitting: Medical

## 2019-06-02 NOTE — Telephone Encounter (Signed)
Will you call pt and let him know reviewed his chart after getting letter from insurance. He can stop fenofibrate as his lipids looked very good. But very important to continue atorvastatin.  Have him follow up end of august in morning. Recommend come in fasting and will repeat lipid panel. After we get those results back will determine if he needs to resart fenofibrate.

## 2019-06-02 NOTE — Telephone Encounter (Signed)
Opened to review and dc fenofibrate.

## 2019-06-05 NOTE — Telephone Encounter (Signed)
Pt wants to be called back as he can not hold, working ok for [employer] 336 [phone]

## 2019-06-05 NOTE — Telephone Encounter (Signed)
Left pt a message to call back. Okay for [facility] to give information.  

## 2019-06-06 NOTE — Telephone Encounter (Signed)
Patient called, left VM to return call to the office for a message from [employer], [employer].

## 2019-06-12 ENCOUNTER — Ambulatory Visit: Payer: Medicare Other | Admitting: Family Medicine
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
## 2019-06-21 ENCOUNTER — Other Ambulatory Visit: Payer: Self-pay | Admitting: Medical

## 2019-06-26 ENCOUNTER — Telehealth: Payer: Self-pay | Admitting: Medical

## 2019-06-26 NOTE — Telephone Encounter (Signed)
Needs appt

## 2019-06-26 NOTE — Telephone Encounter (Signed)
Copied from [place] [id number]. Topic: Quick Communication - Rx Refill/Question >> Jun 26, 2019  9:27 AM Erick Blinks wrote: Medication: Meloxicam - pt would like to be prescribed med. Please advise if appt is necessary.   Has the patient contacted their pharmacy? Yes  (Agent: If no, request that the patient contact the pharmacy for the refill.) (Agent: If yes, when and what did the pharmacy advise?)  Preferred Pharmacy (with phone number or street name): [facility] [place], [place] [address] [place] [address] Phone: [phone] Fax: [phone]    Agent: Please be advised that RX refills may take up to 3 business days. We ask that you follow-up with your pharmacy.

## 2019-06-26 NOTE — Telephone Encounter (Signed)
Called pt and he states he will hold off for now. He said previous PCP prescribed and he felt like it helped all his joints from hurting. He will wait for appt in August with [name] to discuss.

## 2019-07-11 ENCOUNTER — Ambulatory Visit: Payer: Self-pay | Admitting: Medical

## 2019-07-11 NOTE — Telephone Encounter (Signed)
I returned pt's call.   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    See triage notes.    Reason for Disposition . Previously diagnosed hernia    He has an appt scheduled with Mackie Pai, PA-C this coming Tuesday.   He will mention this incident while in the office.  Answer Assessment - Initial Assessment Questions 1. ONSET:  "When did this first appear?"  I returned pt's call.   My naval just naturally protrudes and has been that way for years.    I also have a rather large stomach.   Years ago I was told by another dr if I can push it in not to worry.       Last night it was not as easy to push in as normal but after I had a BM it is soft again and I can push it in like normal.   "I was a little constipated and was straining some.   Will that make it push out more and be hard?   After I had the BM I could push it in and it was soft like it normally is. 2. APPEARANCE: "What does it look like?"     This morning it's still soft and I can easily push it in like normal. 3. SIZE: "How big is it?" (inches, cm or compare to coins, fruit)     Last night before I had a BM it was protruding out a little more than normal and was a little harder than normal. 4. LOCATION: "Where exactly is the hernia located?"     My navel.  5. PATTERN: "Does the swelling come and go, or has it been constant since it started?"     My navel naturally protrudes out.   It has for many years. 6. PAIN: "Is there any pain?" If so, ask: "How bad is it?"  (Scale 1-10; or mild, moderate, severe)     No 7. DIAGNOSIS: "Have you been seen by a doctor for this?" "Did the doctor diagnose you as having a hernia?"     Yes.   Years ago.   He told me as long as it was soft and I could push it in I was fine. 8. OTHER SYMPTOMS: "Do you have any other symptoms?" (e.g., fever, abdominal pain, vomiting)     "I was a little constipated last night and strained some during a BM"   After the BM it was soft and easily pushed in  again. 9. PREGNANCY: "Is there any chance you are pregnant?" "When was your last menstrual period?"     NA  Protocols used: HERNIA-A-AH

## 2019-07-18 ENCOUNTER — Ambulatory Visit (INDEPENDENT_AMBULATORY_CARE_PROVIDER_SITE_OTHER): Payer: Medicare Other | Admitting: Medical

## 2019-07-18 ENCOUNTER — Encounter: Payer: Self-pay | Admitting: Medical

## 2019-07-18 ENCOUNTER — Other Ambulatory Visit: Payer: Self-pay

## 2019-07-18 VITALS — BP 140/88 | HR 80 | Temp 96.9°F | Resp 16 | Ht 72.0 in | Wt 309.6 lb

## 2019-07-18 DIAGNOSIS — I10 Essential (primary) hypertension: Secondary | ICD-10-CM | POA: Diagnosis not present

## 2019-07-18 DIAGNOSIS — E785 Hyperlipidemia, unspecified: Secondary | ICD-10-CM | POA: Diagnosis not present

## 2019-07-18 DIAGNOSIS — E119 Type 2 diabetes mellitus without complications: Secondary | ICD-10-CM | POA: Diagnosis not present
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
## 2019-07-18 NOTE — Patient Instructions (Signed)
Your blood pressure is borderline presently but you did report eating salty food last night and have upcoming dentist appointment.  Recommend that you check your blood pressure 2-3 times over the next week when you are relaxed and hopefully will be closer to 130/80.  If your blood pressure trends close to 148/90 then we will need to make adjustment to your BP med regimen.  Your last A1c was 6.7.  Will put in future metabolic panel and Q9U.  If A1c close to 6.5 we will keep you on same dose metformin.  We will keep you on statin presently due to diabetes.  Future lipid panel placed as well.  Remind to get flu vaccine in about a month.  Follow-up 3 months or as needed.

## 2019-07-26 ENCOUNTER — Other Ambulatory Visit: Payer: Self-pay | Admitting: Medical

## 2019-07-30 ENCOUNTER — Other Ambulatory Visit (INDEPENDENT_AMBULATORY_CARE_PROVIDER_SITE_OTHER): Payer: Medicare Other

## 2019-07-30 ENCOUNTER — Other Ambulatory Visit: Payer: Self-pay

## 2019-07-30 DIAGNOSIS — E119 Type 2 diabetes mellitus without complications: Secondary | ICD-10-CM

## 2019-07-30 DIAGNOSIS — I10 Essential (primary) hypertension: Secondary | ICD-10-CM

## 2019-07-30 DIAGNOSIS — E785 Hyperlipidemia, unspecified: Secondary | ICD-10-CM

## 2019-07-30 LAB — COMPREHENSIVE METABOLIC PANEL
ALT: 14 U/L (ref 0–53)
AST: 19 U/L (ref 0–37)
Albumin: 3.9 g/dL (ref 3.5–5.2)
Alkaline Phosphatase: 63 U/L (ref 39–117)
BUN: 18 mg/dL (ref 6–23)
CO2: 28 mEq/L (ref 19–32)
Calcium: 9.5 mg/dL (ref 8.4–10.5)
Chloride: 101 mEq/L (ref 96–112)
Creatinine, Ser: 0.97 mg/dL (ref 0.40–1.50)
GFR: 93.47 mL/min (ref >60.00)
Glucose, Bld: 107 mg/dL — ABNORMAL HIGH (ref 70–99)
Potassium: 4.4 mEq/L (ref 3.5–5.1)
Sodium: 138 mEq/L (ref 135–145)
Total Bilirubin: 0.7 mg/dL (ref 0.2–1.2)
Total Protein: 6.7 g/dL (ref 6.0–8.3)

## 2019-07-30 LAB — LIPID PANEL
Cholesterol: 139 mg/dL (ref 0–200)
HDL: 29.6 mg/dL — ABNORMAL LOW (ref >39.00)
LDL Cholesterol: 82 mg/dL (ref 0–99)
NonHDL: 109.59
Total CHOL/HDL Ratio: 5
Triglycerides: 136 mg/dL (ref 0.0–149.0)
VLDL: 27.2 mg/dL (ref 0.0–40.0)

## 2019-07-30 LAB — HEMOGLOBIN A1C: Hgb A1c MFr Bld: 6.6 % — ABNORMAL HIGH (ref 4.6–6.5)

## 2019-08-01 ENCOUNTER — Other Ambulatory Visit: Payer: Self-pay

## 2019-08-01 MED ORDER — METFORMIN HCL ER 500 MG PO TB24: ORAL_TABLET | ORAL | 1 refills | Status: DC

## 2019-09-25 ENCOUNTER — Other Ambulatory Visit: Payer: Self-pay

## 2019-09-25 MED ORDER — FENOFIBRATE 54 MG PO TABS: ORAL_TABLET | ORAL | 3 refills | Status: DC

## 2019-09-25 MED ORDER — LISINOPRIL-HYDROCHLOROTHIAZIDE 20-25 MG PO TABS: 1.0000 | ORAL_TABLET | Freq: Every day | ORAL | 0 refills | Status: DC

## 2019-10-29 ENCOUNTER — Other Ambulatory Visit: Payer: Self-pay

## 2019-10-30 ENCOUNTER — Other Ambulatory Visit: Payer: Self-pay

## 2019-10-30 ENCOUNTER — Encounter: Payer: Self-pay | Admitting: Medical

## 2019-10-30 ENCOUNTER — Ambulatory Visit (INDEPENDENT_AMBULATORY_CARE_PROVIDER_SITE_OTHER): Payer: Medicare Other | Admitting: Medical

## 2019-10-30 VITALS — BP 138/86 | HR 74 | Temp 96.0°F | Resp 16 | Ht 72.0 in | Wt 314.6 lb

## 2019-10-30 DIAGNOSIS — E119 Type 2 diabetes mellitus without complications: Secondary | ICD-10-CM | POA: Diagnosis not present

## 2019-10-30 DIAGNOSIS — I10 Essential (primary) hypertension: Secondary | ICD-10-CM

## 2019-10-30 DIAGNOSIS — E785 Hyperlipidemia, unspecified: Secondary | ICD-10-CM | POA: Diagnosis not present

## 2019-10-30 LAB — HEMOGLOBIN A1C: Hgb A1c MFr Bld: 6.4 % (ref 4.6–6.5)

## 2019-10-30 LAB — COMPREHENSIVE METABOLIC PANEL
ALT: 24 U/L (ref 0–53)
AST: 38 U/L — ABNORMAL HIGH (ref 0–37)
Albumin: 4 g/dL (ref 3.5–5.2)
Alkaline Phosphatase: 67 U/L (ref 39–117)
BUN: 20 mg/dL (ref 6–23)
CO2: 29 mEq/L (ref 19–32)
Calcium: 9.8 mg/dL (ref 8.4–10.5)
Chloride: 100 mEq/L (ref 96–112)
Creatinine, Ser: 0.89 mg/dL (ref 0.40–1.50)
GFR: 103.15 mL/min (ref >60.00)
Glucose, Bld: 110 mg/dL — ABNORMAL HIGH (ref 70–99)
Potassium: 4.1 mEq/L (ref 3.5–5.1)
Sodium: 138 mEq/L (ref 135–145)
Total Bilirubin: 1 mg/dL (ref 0.2–1.2)
Total Protein: 6.8 g/dL (ref 6.0–8.3)

## 2019-10-30 LAB — LIPID PANEL
Cholesterol: 142 mg/dL (ref 0–200)
HDL: 30.7 mg/dL — ABNORMAL LOW (ref >39.00)
LDL Cholesterol: 86 mg/dL (ref 0–99)
NonHDL: 111.07
Total CHOL/HDL Ratio: 5
Triglycerides: 127 mg/dL (ref 0.0–149.0)
VLDL: 25.4 mg/dL (ref 0.0–40.0)
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
## 2019-10-30 NOTE — Patient Instructions (Addendum)
For diabetes, will get a1c and cmp.  For high cholesterol, will get lipid panel.  For htn continue current meds.   Reconsider flu vaccine as we discussed. Declined today. If you change your mind let me know.  Follow up date be determined after lab review.

## 2019-10-31 ENCOUNTER — Telehealth: Payer: Self-pay | Admitting: Medical

## 2019-10-31 MED ORDER — ATORVASTATIN CALCIUM 40 MG PO TABS: ORAL_TABLET | ORAL | 1 refills | Status: DC

## 2019-10-31 MED ORDER — METFORMIN HCL ER 500 MG PO TB24: ORAL_TABLET | ORAL | 1 refills | Status: DC

## 2019-10-31 NOTE — Telephone Encounter (Signed)
Rx metformin and atorvastatin sent to pt pharmacy. 

## 2019-11-02 ENCOUNTER — Other Ambulatory Visit: Payer: Self-pay

## 2019-11-02 ENCOUNTER — Ambulatory Visit (INDEPENDENT_AMBULATORY_CARE_PROVIDER_SITE_OTHER): Payer: Medicare Other

## 2019-11-02 DIAGNOSIS — Z23 Encounter for immunization: Secondary | ICD-10-CM | POA: Diagnosis not present

## 2019-11-02 NOTE — Progress Notes (Signed)
Pre visit review using our clinic review tool, if applicable. No additional management support is needed unless otherwise documented below in the visit note.  Patient here for flu vaccine. 0.5mL flu vaccine given in left deltoid IM. Patient tolerated well. VIS given.   

## 2019-11-06 ENCOUNTER — Other Ambulatory Visit: Payer: Self-pay | Admitting: *Deleted

## 2019-11-06 MED ORDER — MONTELUKAST SODIUM 10 MG PO TABS: ORAL_TABLET | ORAL | 1 refills | Status: DC

## 2019-12-25 ENCOUNTER — Other Ambulatory Visit: Payer: Self-pay | Admitting: Medical

## 2020-01-22 ENCOUNTER — Other Ambulatory Visit: Payer: Self-pay | Admitting: Medical

## 2020-01-30 ENCOUNTER — Other Ambulatory Visit: Payer: Self-pay

## 2020-01-31 ENCOUNTER — Other Ambulatory Visit: Payer: Self-pay

## 2020-01-31 ENCOUNTER — Encounter: Payer: Self-pay | Admitting: Medical

## 2020-01-31 ENCOUNTER — Ambulatory Visit (INDEPENDENT_AMBULATORY_CARE_PROVIDER_SITE_OTHER): Payer: Medicare Other | Admitting: Medical

## 2020-01-31 VITALS — BP 139/78 | HR 92 | Temp 97.6°F | Resp 18 | Ht 72.0 in | Wt 320.6 lb

## 2020-01-31 DIAGNOSIS — E119 Type 2 diabetes mellitus without complications: Secondary | ICD-10-CM | POA: Diagnosis not present

## 2020-01-31 DIAGNOSIS — E785 Hyperlipidemia, unspecified: Secondary | ICD-10-CM | POA: Diagnosis not present

## 2020-01-31 DIAGNOSIS — I10 Essential (primary) hypertension: Secondary | ICD-10-CM | POA: Diagnosis not present

## 2020-01-31 LAB — COMPREHENSIVE METABOLIC PANEL
ALT: 22 U/L (ref 0–53)
AST: 32 U/L (ref 0–37)
Albumin: 4.1 g/dL (ref 3.5–5.2)
Alkaline Phosphatase: 77 U/L (ref 39–117)
BUN: 20 mg/dL (ref 6–23)
CO2: 28 mEq/L (ref 19–32)
Calcium: 10.1 mg/dL (ref 8.4–10.5)
Chloride: 98 mEq/L (ref 96–112)
Creatinine, Ser: 1.06 mg/dL (ref 0.40–1.50)
GFR: 84.24 mL/min (ref >60.00)
Glucose, Bld: 105 mg/dL — ABNORMAL HIGH (ref 70–99)
Potassium: 3.9 mEq/L (ref 3.5–5.1)
Sodium: 139 mEq/L (ref 135–145)
Total Bilirubin: 0.7 mg/dL (ref 0.2–1.2)
Total Protein: 7.5 g/dL (ref 6.0–8.3)

## 2020-01-31 LAB — LIPID PANEL
Cholesterol: 145 mg/dL (ref 0–200)
HDL: 29.1 mg/dL — ABNORMAL LOW (ref >39.00)
NonHDL: 115.68
Total CHOL/HDL Ratio: 5
Triglycerides: 288 mg/dL — ABNORMAL HIGH (ref 0.0–149.0)
VLDL: 57.6 mg/dL — ABNORMAL HIGH (ref 0.0–40.0)

## 2020-01-31 LAB — HEMOGLOBIN A1C: Hgb A1c MFr Bld: 6.5 % (ref 4.6–6.5)

## 2020-01-31 LAB — LDL CHOLESTEROL, DIRECT: Direct LDL: 79 mg/dL

## 2020-01-31 MED ORDER — AMLODIPINE BESYLATE 2.5 MG PO TABS: 2.5000 mg | ORAL_TABLET | Freq: Every day | ORAL | 3 refills | Status: DC

## 2020-01-31 NOTE — Patient Instructions (Signed)
For diabetes, will get a1c and cmp.  For high cholesterol, will get lipid panel.  For htn borderline again will add low dose amlodipine 2.5 mg daily.  Get covid vaccine if available.  Follow up in 3 months or as needed
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
## 2020-02-21 ENCOUNTER — Ambulatory Visit: Payer: Medicare Other | Attending: Internal Medicine

## 2020-02-21 DIAGNOSIS — Z23 Encounter for immunization: Secondary | ICD-10-CM

## 2020-02-21 NOTE — Progress Notes (Signed)
   Covid-19 Vaccination Clinic  Name:  Tyler Maynard    MRN: [id number] DOB: 1951-05-09  02/21/2020  [name] was observed post Covid-19 immunization for 15 minutes without incident. He was provided with Vaccine Information Sheet and instruction to access the V-Safe system.   [name] was instructed to call 911 with any severe reactions post vaccine: Marland Kitchen Difficulty breathing  . Swelling of face and throat  . A fast heartbeat  . A bad rash all over body  . Dizziness and weakness   Immunizations Administered    Name Date Dose VIS Date Route   Pfizer COVID-19 Vaccine 02/21/2020 11:43 AM 0.3 mL 11/09/2019 Intramuscular   Manufacturer: [place]   Lot: [id number]   [place]: [id number]

## 2020-03-17 ENCOUNTER — Ambulatory Visit: Payer: Medicare Other | Attending: Internal Medicine

## 2020-03-17 DIAGNOSIS — Z23 Encounter for immunization: Secondary | ICD-10-CM

## 2020-03-17 NOTE — Progress Notes (Signed)
   Covid-19 Vaccination Clinic  Name:  Tyler Maynard    MRN: [id number] DOB: 1951/06/28  03/17/2020  [name] was observed post Covid-19 immunization for 15 minutes without incident. He was provided with Vaccine Information Sheet and instruction to access the V-Safe system.   [name] was instructed to call 911 with any severe reactions post vaccine: Marland Kitchen Difficulty breathing  . Swelling of face and throat  . A fast heartbeat  . A bad rash all over body  . Dizziness and weakness   Immunizations Administered    Name Date Dose VIS Date Route   Pfizer COVID-19 Vaccine 03/17/2020 10:37 AM 0.3 mL 01/23/2019 Intramuscular   Manufacturer: [place]   Lot: [id number]   [place]: [id number]

## 2020-03-22 ENCOUNTER — Other Ambulatory Visit: Payer: Self-pay | Admitting: Medical

## 2020-04-28 ENCOUNTER — Other Ambulatory Visit: Payer: Self-pay | Admitting: Medical

## 2020-05-02 ENCOUNTER — Ambulatory Visit (INDEPENDENT_AMBULATORY_CARE_PROVIDER_SITE_OTHER): Payer: Medicare Other | Admitting: Medical

## 2020-05-02 ENCOUNTER — Other Ambulatory Visit: Payer: Self-pay

## 2020-05-02 VITALS — BP 138/73 | HR 81 | Resp 20 | Ht 72.0 in | Wt 309.6 lb

## 2020-05-02 DIAGNOSIS — E119 Type 2 diabetes mellitus without complications: Secondary | ICD-10-CM

## 2020-05-02 DIAGNOSIS — I10 Essential (primary) hypertension: Secondary | ICD-10-CM | POA: Diagnosis not present

## 2020-05-02 DIAGNOSIS — E785 Hyperlipidemia, unspecified: Secondary | ICD-10-CM

## 2020-05-02 DIAGNOSIS — S41112A Laceration without foreign body of left upper arm, initial encounter: Secondary | ICD-10-CM | POA: Diagnosis not present

## 2020-05-02 LAB — COMPREHENSIVE METABOLIC PANEL
ALT: 19 U/L (ref 0–53)
AST: 26 U/L (ref 0–37)
Albumin: 4 g/dL (ref 3.5–5.2)
Alkaline Phosphatase: 68 U/L (ref 39–117)
BUN: 20 mg/dL (ref 6–23)
CO2: 29 mEq/L (ref 19–32)
Calcium: 9.5 mg/dL (ref 8.4–10.5)
Chloride: 99 mEq/L (ref 96–112)
Creatinine, Ser: 1 mg/dL (ref 0.40–1.50)
GFR: 90.03 mL/min (ref >60.00)
Glucose, Bld: 109 mg/dL — ABNORMAL HIGH (ref 70–99)
Potassium: 3.8 mEq/L (ref 3.5–5.1)
Sodium: 139 mEq/L (ref 135–145)
Total Bilirubin: 0.7 mg/dL (ref 0.2–1.2)
Total Protein: 6.9 g/dL (ref 6.0–8.3)

## 2020-05-02 LAB — LIPID PANEL
Cholesterol: 145 mg/dL (ref 0–200)
HDL: 31.8 mg/dL — ABNORMAL LOW (ref >39.00)
LDL Cholesterol: 85 mg/dL (ref 0–99)
NonHDL: 113.24
Total CHOL/HDL Ratio: 5
Triglycerides: 140 mg/dL (ref 0.0–149.0)
VLDL: 28 mg/dL (ref 0.0–40.0)

## 2020-05-02 LAB — HEMOGLOBIN A1C: Hgb A1c MFr Bld: 6.5 % (ref 4.6–6.5)

## 2020-05-02 NOTE — Patient Instructions (Addendum)
For diabetes, will get a1c and cmp.  For high cholesterol, will get lipid panel.  For htn continue current meds.   Glad to see that you are very active and had some purposeful.  I do think we can give you tdap today. But you can check with your insurance to verify covered.  Follow up in 3 months or as needed
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
## 2020-05-21 ENCOUNTER — Other Ambulatory Visit: Payer: Self-pay | Admitting: Medical

## 2020-05-25 ENCOUNTER — Other Ambulatory Visit: Payer: Self-pay | Admitting: Medical

## 2020-06-19 ENCOUNTER — Other Ambulatory Visit: Payer: Self-pay | Admitting: Medical

## 2020-07-08 ENCOUNTER — Other Ambulatory Visit: Payer: Self-pay | Admitting: Medical

## 2020-08-08 ENCOUNTER — Other Ambulatory Visit: Payer: Self-pay

## 2020-08-08 ENCOUNTER — Ambulatory Visit: Payer: Medicare Other | Admitting: Medical

## 2020-08-08 ENCOUNTER — Ambulatory Visit (INDEPENDENT_AMBULATORY_CARE_PROVIDER_SITE_OTHER): Payer: Medicare Other | Admitting: Medical

## 2020-08-08 VITALS — BP 130/77 | HR 74 | Temp 98.5°F | Resp 20 | Ht 72.0 in | Wt 303.2 lb

## 2020-08-08 DIAGNOSIS — Z23 Encounter for immunization: Secondary | ICD-10-CM | POA: Diagnosis not present

## 2020-08-08 DIAGNOSIS — I10 Essential (primary) hypertension: Secondary | ICD-10-CM | POA: Diagnosis not present

## 2020-08-08 DIAGNOSIS — E119 Type 2 diabetes mellitus without complications: Secondary | ICD-10-CM | POA: Diagnosis not present

## 2020-08-08 DIAGNOSIS — E785 Hyperlipidemia, unspecified: Secondary | ICD-10-CM
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
## 2020-08-08 NOTE — Patient Instructions (Signed)
For diabetes, will get a1c and cmp. Continue metformin. Might modify tx if a1c increased.  For high cholesterol, will get lipid panel. Continue lipitor.  For htn continue current meds amlodipoine and zestoretic.  Glad to see that you are very active and had some purposeful work.   Follow up in 3 months or as needed

## 2020-08-09 LAB — HEMOGLOBIN A1C
Hgb A1c MFr Bld: 6.2 % of total Hgb — ABNORMAL HIGH (ref <5.7)
Mean Plasma Glucose: 131 (calc)
eAG (mmol/L): 7.3 (calc)

## 2020-08-09 LAB — COMPREHENSIVE METABOLIC PANEL
AG Ratio: 1.3 (calc) (ref 1.0–2.5)
ALT: 23 U/L (ref 9–46)
AST: 33 U/L (ref 10–35)
Albumin: 4.4 g/dL (ref 3.6–5.1)
Alkaline phosphatase (APISO): 64 U/L (ref 35–144)
BUN: 17 mg/dL (ref 7–25)
CO2: 31 mmol/L (ref 20–32)
Calcium: 10.3 mg/dL (ref 8.6–10.3)
Chloride: 96 mmol/L — ABNORMAL LOW (ref 98–110)
Creat: 0.95 mg/dL (ref 0.70–1.25)
Globulin: 3.3 g/dL (calc) (ref 1.9–3.7)
Glucose, Bld: 86 mg/dL (ref 65–99)
Potassium: 4.2 mmol/L (ref 3.5–5.3)
Sodium: 138 mmol/L (ref 135–146)
Total Bilirubin: 1 mg/dL (ref 0.2–1.2)
Total Protein: 7.7 g/dL (ref 6.1–8.1)

## 2020-08-09 LAB — CBC WITH DIFFERENTIAL/PLATELET
Absolute Monocytes: 770 cells/uL (ref 200–950)
Basophils Absolute: 110 cells/uL (ref 0–200)
Basophils Relative: 2 %
Eosinophils Absolute: 248 cells/uL (ref 15–500)
Eosinophils Relative: 4.5 %
HCT: 42.2 % (ref 38.5–50.0)
Hemoglobin: 14.3 g/dL (ref 13.2–17.1)
Lymphs Abs: 1606 cells/uL (ref 850–3900)
MCH: 27.9 pg (ref 27.0–33.0)
MCHC: 33.9 g/dL (ref 32.0–36.0)
MCV: 82.4 fL (ref 80.0–100.0)
MPV: 11 fL (ref 7.5–12.5)
Monocytes Relative: 14 %
Neutro Abs: 2767 cells/uL (ref 1500–7800)
Neutrophils Relative %: 50.3 %
Platelets: 301 10*3/uL (ref 140–400)
RBC: 5.12 10*6/uL (ref 4.20–5.80)
RDW: 14.4 % (ref 11.0–15.0)
Total Lymphocyte: 29.2 %
WBC: 5.5 10*3/uL (ref 3.8–10.8)

## 2020-08-09 LAB — LIPID PANEL
Cholesterol: 166 mg/dL (ref <200)
HDL: 39 mg/dL — ABNORMAL LOW (ref >=40)
LDL Cholesterol (Calc): 104 mg/dL (calc) — ABNORMAL HIGH
Non-HDL Cholesterol (Calc): 127 mg/dL (calc) (ref <130)
Total CHOL/HDL Ratio: 4.3 (calc) (ref <5.0)
Triglycerides: 134 mg/dL (ref <150)

## 2020-09-04 ENCOUNTER — Other Ambulatory Visit: Payer: Self-pay | Admitting: Medical

## 2020-09-12 ENCOUNTER — Other Ambulatory Visit: Payer: Self-pay | Admitting: Medical

## 2020-10-25 ENCOUNTER — Other Ambulatory Visit: Payer: Self-pay | Admitting: Medical

## 2020-11-15 ENCOUNTER — Other Ambulatory Visit: Payer: Self-pay | Admitting: Medical

## 2020-12-10 ENCOUNTER — Other Ambulatory Visit: Payer: Self-pay

## 2020-12-10 ENCOUNTER — Ambulatory Visit (INDEPENDENT_AMBULATORY_CARE_PROVIDER_SITE_OTHER): Payer: Medicare Other | Admitting: Medical

## 2020-12-10 ENCOUNTER — Encounter: Payer: Self-pay | Admitting: Medical

## 2020-12-10 VITALS — BP 124/80 | HR 80 | Temp 98.6°F | Resp 18 | Ht 72.0 in | Wt 307.6 lb

## 2020-12-10 DIAGNOSIS — I10 Essential (primary) hypertension: Secondary | ICD-10-CM

## 2020-12-10 DIAGNOSIS — E119 Type 2 diabetes mellitus without complications: Secondary | ICD-10-CM

## 2020-12-10 DIAGNOSIS — E785 Hyperlipidemia, unspecified: Secondary | ICD-10-CM | POA: Diagnosis not present

## 2020-12-10 MED ORDER — LISINOPRIL-HYDROCHLOROTHIAZIDE 20-25 MG PO TABS: 1.0000 | ORAL_TABLET | Freq: Every day | ORAL | 3 refills | Status: DC
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
## 2020-12-10 NOTE — Patient Instructions (Addendum)
For diabetes, will get a1c and cmp. Continue metformin. Might modify tx if a1c increased.  For high cholesterol, will get lipid panel. Continue lipitor.  For htn continue current meds amlodipoine and zestoretic.  Glad to see you are now working. Try to limit calories. Not overeat at night.  Follow up in 3 months or as needed  Lab appointment 3:15 on Monday.

## 2020-12-11 ENCOUNTER — Telehealth: Payer: Self-pay | Admitting: *Deleted

## 2020-12-11 ENCOUNTER — Telehealth: Payer: Self-pay | Admitting: Medical

## 2020-12-11 DIAGNOSIS — E785 Hyperlipidemia, unspecified: Secondary | ICD-10-CM

## 2020-12-11 DIAGNOSIS — I10 Essential (primary) hypertension: Secondary | ICD-10-CM

## 2020-12-11 DIAGNOSIS — E119 Type 2 diabetes mellitus without complications: Secondary | ICD-10-CM

## 2020-12-11 NOTE — Telephone Encounter (Signed)
Future labs placed. 

## 2020-12-11 NOTE — Telephone Encounter (Signed)
Pt has lab appt on Monday. No future orders are in Epic.  Please place orders if appropriate.

## 2020-12-15 ENCOUNTER — Other Ambulatory Visit: Payer: Medicare Other

## 2020-12-17 ENCOUNTER — Other Ambulatory Visit: Payer: Self-pay

## 2020-12-17 ENCOUNTER — Other Ambulatory Visit (INDEPENDENT_AMBULATORY_CARE_PROVIDER_SITE_OTHER): Payer: Medicare Other

## 2020-12-17 DIAGNOSIS — E119 Type 2 diabetes mellitus without complications: Secondary | ICD-10-CM

## 2020-12-17 DIAGNOSIS — E785 Hyperlipidemia, unspecified: Secondary | ICD-10-CM | POA: Diagnosis not present

## 2020-12-17 DIAGNOSIS — I10 Essential (primary) hypertension: Secondary | ICD-10-CM | POA: Diagnosis not present

## 2020-12-18 LAB — COMPREHENSIVE METABOLIC PANEL
ALT: 25 U/L (ref 0–53)
AST: 45 U/L — ABNORMAL HIGH (ref 0–37)
Albumin: 4.4 g/dL (ref 3.5–5.2)
Alkaline Phosphatase: 67 U/L (ref 39–117)
BUN: 15 mg/dL (ref 6–23)
CO2: 28 mEq/L (ref 19–32)
Calcium: 10.3 mg/dL (ref 8.4–10.5)
Chloride: 99 mEq/L (ref 96–112)
Creatinine, Ser: 1.03 mg/dL (ref 0.40–1.50)
GFR: 74.81 mL/min (ref >60.00)
Glucose, Bld: 98 mg/dL (ref 70–99)
Potassium: 3.7 mEq/L (ref 3.5–5.1)
Sodium: 136 mEq/L (ref 135–145)
Total Bilirubin: 1.1 mg/dL (ref 0.2–1.2)
Total Protein: 7.6 g/dL (ref 6.0–8.3)

## 2020-12-18 LAB — LIPID PANEL
Cholesterol: 159 mg/dL (ref 0–200)
HDL: 32.5 mg/dL — ABNORMAL LOW (ref >39.00)
LDL Cholesterol: 104 mg/dL — ABNORMAL HIGH (ref 0–99)
NonHDL: 126.8
Total CHOL/HDL Ratio: 5
Triglycerides: 113 mg/dL (ref 0.0–149.0)
VLDL: 22.6 mg/dL (ref 0.0–40.0)

## 2020-12-18 LAB — HEMOGLOBIN A1C: Hgb A1c MFr Bld: 6.5 % (ref 4.6–6.5)

## 2021-01-09 ENCOUNTER — Other Ambulatory Visit: Payer: Self-pay | Admitting: Medical

## 2021-01-10 ENCOUNTER — Other Ambulatory Visit: Payer: Self-pay | Admitting: Medical

## 2021-03-17 ENCOUNTER — Other Ambulatory Visit: Payer: Self-pay

## 2021-03-17 ENCOUNTER — Ambulatory Visit (INDEPENDENT_AMBULATORY_CARE_PROVIDER_SITE_OTHER): Payer: Medicare Other | Admitting: Medical

## 2021-03-17 VITALS — BP 122/75 | HR 81 | Temp 97.9°F | Resp 18 | Ht 72.0 in | Wt 304.0 lb

## 2021-03-17 DIAGNOSIS — I10 Essential (primary) hypertension: Secondary | ICD-10-CM

## 2021-03-17 DIAGNOSIS — K429 Umbilical hernia without obstruction or gangrene: Secondary | ICD-10-CM | POA: Diagnosis not present

## 2021-03-17 DIAGNOSIS — E119 Type 2 diabetes mellitus without complications: Secondary | ICD-10-CM

## 2021-03-17 DIAGNOSIS — E785 Hyperlipidemia, unspecified: Secondary | ICD-10-CM | POA: Diagnosis not present
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
## 2021-03-17 NOTE — Patient Instructions (Signed)
For diabetes, will get a1c later since on review too soon. Can put in future labs when below labs reviewed.  For high cholesterol, will get lipid panel. Continue lipitor.  For htn continue current meds amlodipoine and zestoretic.  Glad to see you are now working. Try to limit calories. Not overeat at night.   Discussed umbilical hernia. Conservative measures explained but if complications then ED evaluation. Let me know if you change mind on surgeon referral. Declined today.  Follow up in 3 months or as needed   Umbilical Hernia, Adult  A hernia is a bulge of tissue that pushes through an opening between muscles. An umbilical hernia happens in the abdomen, near the belly button (umbilicus). The hernia may contain tissues from the small intestine, large intestine, or fatty tissue covering the intestines (omentum). Umbilical hernias in adults tend to get worse over time, and they require surgical treatment. There are several types of umbilical hernias. You may have:  A hernia located just above or below the umbilicus (indirect hernia). This is the most common type of umbilical hernia in adults.  A hernia that forms through an opening formed by the umbilicus (direct hernia).  A hernia that comes and goes (reducible hernia). A reducible hernia may be visible only when you strain, lift something heavy, or cough. This type of hernia can be pushed back into the abdomen (reduced).  A hernia that traps abdominal tissue inside the hernia (incarcerated hernia). This type of hernia cannot be reduced.  A hernia that cuts off blood flow to the tissues inside the hernia (strangulated hernia). The tissues can start to die if this happens. This type of hernia requires emergency treatment. What are the causes? An umbilical hernia happens when tissue inside the abdomen presses on a weak area of the abdominal muscles. What increases the risk? You may have a greater risk of this condition if  you:  Are obese.  Have had several pregnancies.  Have a buildup of fluid inside your abdomen (ascites).  Have had surgery that weakens the abdominal muscles. What are the signs or symptoms? The main symptom of this condition is a painless bulge at or near the belly button. A reducible hernia may be visible only when you strain, lift something heavy, or cough. Other symptoms may include:  Dull pain.  A feeling of pressure. Symptoms of a strangulated hernia may include:  Pain that gets increasingly worse.  Nausea and vomiting.  Pain when pressing on the hernia.  Skin over the hernia becoming red or purple.  Constipation.  Blood in the stool. How is this diagnosed? This condition may be diagnosed based on:  A physical exam. You may be asked to cough or strain while standing. These actions increase the pressure inside your abdomen and force the hernia through the opening in your muscles. Your health care provider may try to reduce the hernia by pressing on it.  Your symptoms and medical history. How is this treated? Surgery is the only treatment for an umbilical hernia. Surgery for a strangulated hernia is done as soon as possible. If you have a small hernia that is not incarcerated, you may need to lose weight before having surgery. Follow these instructions at home:  Lose weight, if told by your health care provider.  Do not try to push the hernia back in.  Watch your hernia for any changes in color or size. Tell your health care provider if any changes occur.  You may need to avoid  activities that increase pressure on your hernia.  Do not lift anything that is heavier than 10 lb (4.5 kg) until your health care provider says that this is safe.  Take over-the-counter and prescription medicines only as told by your health care provider.  Keep all follow-up visits as told by your health care provider. This is important. Contact a health care provider if:  Your hernia  gets larger.  Your hernia becomes painful. Get help right away if:  You develop sudden, severe pain near the area of your hernia.  You have pain as well as nausea or vomiting.  You have pain and the skin over your hernia changes color.  You develop a fever. This information is not intended to replace advice given to you by your health care provider. Make sure you discuss any questions you have with your health care provider. Document Revised: 12/28/2017 Document Reviewed: 05/16/2017 Elsevier Patient Education  [place].

## 2021-03-18 ENCOUNTER — Telehealth: Payer: Self-pay | Admitting: Medical

## 2021-03-18 DIAGNOSIS — R739 Hyperglycemia, unspecified: Secondary | ICD-10-CM

## 2021-03-18 LAB — LIPID PANEL
Cholesterol: 160 mg/dL (ref 0–200)
HDL: 37.1 mg/dL — ABNORMAL LOW (ref >39.00)
LDL Cholesterol: 101 mg/dL — ABNORMAL HIGH (ref 0–99)
NonHDL: 122.68
Total CHOL/HDL Ratio: 4
Triglycerides: 107 mg/dL (ref 0.0–149.0)
VLDL: 21.4 mg/dL (ref 0.0–40.0)

## 2021-03-18 LAB — COMPREHENSIVE METABOLIC PANEL
ALT: 24 U/L (ref 0–53)
AST: 30 U/L (ref 0–37)
Albumin: 4.1 g/dL (ref 3.5–5.2)
Alkaline Phosphatase: 65 U/L (ref 39–117)
BUN: 21 mg/dL (ref 6–23)
CO2: 29 mEq/L (ref 19–32)
Calcium: 10.5 mg/dL (ref 8.4–10.5)
Chloride: 97 mEq/L (ref 96–112)
Creatinine, Ser: 1.02 mg/dL (ref 0.40–1.50)
GFR: 75.56 mL/min (ref >60.00)
Glucose, Bld: 88 mg/dL (ref 70–99)
Potassium: 4.4 mEq/L (ref 3.5–5.1)
Sodium: 136 mEq/L (ref 135–145)
Total Bilirubin: 1 mg/dL (ref 0.2–1.2)
Total Protein: 7.6 g/dL (ref 6.0–8.3)

## 2021-03-18 NOTE — Telephone Encounter (Signed)
Future A1c placed.

## 2021-04-01 ENCOUNTER — Other Ambulatory Visit (INDEPENDENT_AMBULATORY_CARE_PROVIDER_SITE_OTHER): Payer: Medicare Other

## 2021-04-01 ENCOUNTER — Other Ambulatory Visit: Payer: Self-pay

## 2021-04-01 DIAGNOSIS — R739 Hyperglycemia, unspecified: Secondary | ICD-10-CM

## 2021-04-02 LAB — HEMOGLOBIN A1C: Hgb A1c MFr Bld: 6.5 % (ref 4.6–6.5)

## 2021-04-06 ENCOUNTER — Other Ambulatory Visit: Payer: Self-pay | Admitting: Medical

## 2021-04-06 ENCOUNTER — Telehealth: Payer: Self-pay | Admitting: Medical

## 2021-04-06 MED ORDER — METFORMIN HCL ER 500 MG PO TB24: ORAL_TABLET | ORAL | 1 refills | Status: DC

## 2021-04-06 NOTE — Telephone Encounter (Signed)
Medication:metFORMIN (GLUCOPHAGE-XR) 500 MG 24 hr tablet   Has the patient contacted their pharmacy? Yes.   (If no, request that the patient contact the pharmacy for the refill.) (If yes, when and what did the pharmacy advise?) refill was denied to call PCP    Preferred Pharmacy (with phone number or street name):   [facility] [place], [address]  [place], [place] [address]  Phone:  [phone] Fax:  [phone]  Agent: Please be advised that RX refills may take up to 3 business days. We ask that you follow-up with your pharmacy.

## 2021-04-06 NOTE — Telephone Encounter (Signed)
Med resent in

## 2021-04-22 ENCOUNTER — Other Ambulatory Visit: Payer: Self-pay | Admitting: Medical

## 2021-04-23 ENCOUNTER — Other Ambulatory Visit: Payer: Self-pay | Admitting: Medical

## 2021-05-14 ENCOUNTER — Other Ambulatory Visit: Payer: Self-pay | Admitting: Medical

## 2021-06-16 ENCOUNTER — Ambulatory Visit: Payer: Medicare Other | Admitting: Medical

## 2021-06-23 ENCOUNTER — Other Ambulatory Visit: Payer: Self-pay

## 2021-06-23 ENCOUNTER — Ambulatory Visit (INDEPENDENT_AMBULATORY_CARE_PROVIDER_SITE_OTHER): Payer: Medicare Other | Admitting: Medical

## 2021-06-23 VITALS — BP 126/66 | HR 85 | Resp 18 | Ht 73.0 in | Wt 309.0 lb

## 2021-06-23 DIAGNOSIS — Z23 Encounter for immunization: Secondary | ICD-10-CM | POA: Diagnosis not present

## 2021-06-23 DIAGNOSIS — E119 Type 2 diabetes mellitus without complications: Secondary | ICD-10-CM

## 2021-06-23 DIAGNOSIS — I10 Essential (primary) hypertension: Secondary | ICD-10-CM | POA: Diagnosis not present

## 2021-06-23 DIAGNOSIS — Z1211 Encounter for screening for malignant neoplasm of colon: Secondary | ICD-10-CM

## 2021-06-23 DIAGNOSIS — E785 Hyperlipidemia, unspecified: Secondary | ICD-10-CM

## 2021-06-23 NOTE — Addendum Note (Signed)
Addended by: Jeronimo Greaves on: 06/23/2021 03:25 PM   Modules accepted: Orders

## 2021-06-23 NOTE — Addendum Note (Signed)
Addended by: Anabel Halon on: 06/23/2021 03:22 PM   Modules accepted: Orders
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
## 2021-06-23 NOTE — Patient Instructions (Addendum)
For diabetes can get a1c mid august. Too early since last a1c 04-01-2021   For high cholesterol, will get lipid panel and cmp today.. Continue lipitor.   For htn continue current meds amlodipoine and zestoretic.   Glad to see you are now working. Try to limit calories. Not overeat at night.  Pcv 20 vaccine today.  Referral to GI MD placed for screening colonoscopy.   Follow up in 3 months or as needed

## 2021-06-24 ENCOUNTER — Encounter: Payer: Self-pay | Admitting: Gastroenterology

## 2021-06-24 ENCOUNTER — Telehealth: Payer: Self-pay | Admitting: Medical

## 2021-06-24 DIAGNOSIS — E119 Type 2 diabetes mellitus without complications: Secondary | ICD-10-CM

## 2021-06-24 LAB — LIPID PANEL
Cholesterol: 172 mg/dL (ref 0–200)
HDL: 35.6 mg/dL — ABNORMAL LOW (ref >39.00)
LDL Cholesterol: 112 mg/dL — ABNORMAL HIGH (ref 0–99)
NonHDL: 136.84
Total CHOL/HDL Ratio: 5
Triglycerides: 123 mg/dL (ref 0.0–149.0)
VLDL: 24.6 mg/dL (ref 0.0–40.0)

## 2021-06-24 LAB — COMPREHENSIVE METABOLIC PANEL
ALT: 28 U/L (ref 0–53)
AST: 34 U/L (ref 0–37)
Albumin: 4.2 g/dL (ref 3.5–5.2)
Alkaline Phosphatase: 68 U/L (ref 39–117)
BUN: 16 mg/dL (ref 6–23)
CO2: 30 mEq/L (ref 19–32)
Calcium: 10.1 mg/dL (ref 8.4–10.5)
Chloride: 99 mEq/L (ref 96–112)
Creatinine, Ser: 0.94 mg/dL (ref 0.40–1.50)
GFR: 83.19 mL/min (ref >60.00)
Glucose, Bld: 96 mg/dL (ref 70–99)
Potassium: 4 mEq/L (ref 3.5–5.1)
Sodium: 138 mEq/L (ref 135–145)
Total Bilirubin: 1 mg/dL (ref 0.2–1.2)
Total Protein: 7.4 g/dL (ref 6.0–8.3)

## 2021-06-24 NOTE — Telephone Encounter (Signed)
Future a1c placed.

## 2021-06-25 ENCOUNTER — Encounter: Payer: Self-pay | Admitting: Gastroenterology

## 2021-07-06 ENCOUNTER — Other Ambulatory Visit: Payer: Self-pay

## 2021-07-06 ENCOUNTER — Other Ambulatory Visit (INDEPENDENT_AMBULATORY_CARE_PROVIDER_SITE_OTHER): Payer: Medicare Other

## 2021-07-06 DIAGNOSIS — E119 Type 2 diabetes mellitus without complications: Secondary | ICD-10-CM | POA: Diagnosis not present

## 2021-07-06 LAB — HEMOGLOBIN A1C: Hgb A1c MFr Bld: 6.4 % (ref 4.6–6.5)

## 2021-07-24 ENCOUNTER — Other Ambulatory Visit: Payer: Self-pay

## 2021-07-24 ENCOUNTER — Encounter: Payer: Self-pay | Admitting: Gastroenterology

## 2021-07-24 ENCOUNTER — Ambulatory Visit (INDEPENDENT_AMBULATORY_CARE_PROVIDER_SITE_OTHER): Payer: Medicare Other | Admitting: Gastroenterology

## 2021-07-24 VITALS — BP 130/78 | HR 82 | Ht 73.0 in | Wt 309.5 lb

## 2021-07-24 DIAGNOSIS — I10 Essential (primary) hypertension: Secondary | ICD-10-CM

## 2021-07-24 DIAGNOSIS — G4733 Obstructive sleep apnea (adult) (pediatric): Secondary | ICD-10-CM | POA: Diagnosis not present

## 2021-07-24 DIAGNOSIS — Z800 Family history of malignant neoplasm of digestive organs: Secondary | ICD-10-CM

## 2021-07-24 DIAGNOSIS — Z6841 Body Mass Index (BMI) 40.0 and over, adult: Secondary | ICD-10-CM

## 2021-07-24 MED ORDER — CLENPIQ 10-3.5-12 MG-GM -GM/160ML PO SOLN: 1.0000 | Freq: Once | ORAL | 0 refills | Status: AC
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
## 2021-07-24 NOTE — Patient Instructions (Addendum)
If you are age 69 or older, your body mass index should be between 23-30. Your Body mass index is 40.83 kg/m. If this is out of the aforementioned range listed, please consider follow up with your Primary Care Provider.  If you are age 21 or younger, your body mass index should be between 19-25. Your Body mass index is 40.83 kg/m. If this is out of the aformentioned range listed, please consider follow up with your Primary Care Provider.   __________________________________________________________  The  GI providers would like to encourage you to use [facility] to communicate with providers for non-urgent requests or questions.  Due to long hold times on the telephone, sending your provider a message by [facility] may be a faster and more efficient way to get a response.  Please allow 48 business hours for a response.  Please remember that this is for non-urgent requests.   You have been scheduled for a colonoscopy. Please follow written instructions given to you at your visit today.  Please pick up your prep supplies at the pharmacy within the next 1-3 days. If you use inhalers (even only as needed), please bring them with you on the day of your procedure.  We have sent the following medications to your pharmacy for you to pick up at your convenience: Clenpiq  Please call with any questions or concerns.  It was a pleasure to see you today!  Vito Cirigliano, D.O.

## 2021-08-10 ENCOUNTER — Ambulatory Visit (INDEPENDENT_AMBULATORY_CARE_PROVIDER_SITE_OTHER): Payer: Medicare Other

## 2021-08-10 VITALS — Ht 73.0 in | Wt 309.0 lb

## 2021-08-10 DIAGNOSIS — Z0000 Encounter for general adult medical examination without abnormal findings: Secondary | ICD-10-CM

## 2021-08-10 NOTE — Patient Instructions (Addendum)
Tyler Maynard , Thank you for taking time to complete your Medicare Wellness Visit. I appreciate your ongoing commitment to your health goals. Please review the following plan we discussed and let me know if I can assist you in the future.   Screening recommendations/referrals: Colonoscopy: Scheduled for 09/03/2021 Recommended yearly ophthalmology/optometry visit for glaucoma screening and checkup Recommended yearly dental visit for hygiene and checkup  Vaccinations: Influenza vaccine: Due-May obtain vaccine at our office or your local pharmacy Pneumococcal vaccine: Up to date Tdap vaccine: Discuss with pharmacy Shingles vaccine: Never had Chicken Pox Covid-19: Booster due-May obtain vaccine at  your local pharmacy  Advanced directives: Declined information today  Conditions/risks identified: See problem list  Next appointment: Follow up in one year for your annual wellness visit. 08/12/2022 @ 3:40  Preventive Care 65 Years and Older, Male Preventive care refers to lifestyle choices and visits with your health care provider that can promote health and wellness. What does preventive care include? A yearly physical exam. This is also called an annual well check. Dental exams once or twice a year. Routine eye exams. Ask your health care provider how often you should have your eyes checked. Personal lifestyle choices, including: Daily care of your teeth and gums. Regular physical activity. Eating a healthy diet. Avoiding tobacco and drug use. Limiting alcohol use. Practicing safe sex. Taking low doses of aspirin every day. Taking vitamin and mineral supplements as recommended by your health care provider. What happens during an annual well check? The services and screenings done by your health care provider during your annual well check will depend on your age, overall health, lifestyle risk factors, and family history of disease. Counseling  Your health care provider may ask you  questions about your: Alcohol use. Tobacco use. Drug use. Emotional well-being. Home and relationship well-being. Sexual activity. Eating habits. History of falls. Memory and ability to understand (cognition). Work and work Statistician. Screening  You may have the following tests or measurements: Height, weight, and BMI. Blood pressure. Lipid and cholesterol levels. These may be checked every 5 years, or more frequently if you are over 43 years old. Skin check. Lung cancer screening. You may have this screening every year starting at age 30 if you have a 30-pack-year history of smoking and currently smoke or have quit within the past 15 years. Fecal occult blood test (FOBT) of the stool. You may have this test every year starting at age 7. Flexible sigmoidoscopy or colonoscopy. You may have a sigmoidoscopy every 5 years or a colonoscopy every 10 years starting at age 23. Prostate cancer screening. Recommendations will vary depending on your family history and other risks. Hepatitis C blood test. Hepatitis B blood test. Sexually transmitted disease (STD) testing. Diabetes screening. This is done by checking your blood sugar (glucose) after you have not eaten for a while (fasting). You may have this done every 1-3 years. Abdominal aortic aneurysm (AAA) screening. You may need this if you are a current or former smoker. Osteoporosis. You may be screened starting at age 69 if you are at high risk. Talk with your health care provider about your test results, treatment options, and if necessary, the need for more tests. Vaccines  Your health care provider may recommend certain vaccines, such as: Influenza vaccine. This is recommended every year. Tetanus, diphtheria, and acellular pertussis (Tdap, Td) vaccine. You may need a Td booster every 10 years. Zoster vaccine. You may need this after age 67. Pneumococcal 13-valent conjugate (PCV13) vaccine. One  dose is recommended after age  21. Pneumococcal polysaccharide (PPSV23) vaccine. One dose is recommended after age 20. Talk to your health care provider about which screenings and vaccines you need and how often you need them. This information is not intended to replace advice given to you by your health care provider. Make sure you discuss any questions you have with your health care provider. Document Released: 12/12/2015 Document Revised: 08/04/2016 Document Reviewed: 09/16/2015 Elsevier Interactive Patient Education  2017 [place] Prevention in the Home Falls can cause injuries. They can happen to people of all ages. There are many things you can do to make your home safe and to help prevent falls. What can I do on the outside of my home? Regularly fix the edges of walkways and driveways and fix any cracks. Remove anything that might make you trip as you walk through a door, such as a raised step or threshold. Trim any bushes or trees on the path to your home. Use bright outdoor lighting. Clear any walking paths of anything that might make someone trip, such as rocks or tools. Regularly check to see if handrails are loose or broken. Make sure that both sides of any steps have handrails. Any raised decks and porches should have guardrails on the edges. Have any leaves, snow, or ice cleared regularly. Use sand or salt on walking paths during winter. Clean up any spills in your garage right away. This includes oil or grease spills. What can I do in the bathroom? Use night lights. Install grab bars by the toilet and in the tub and shower. Do not use towel bars as grab bars. Use non-skid mats or decals in the tub or shower. If you need to sit down in the shower, use a plastic, non-slip stool. Keep the floor dry. Clean up any water that spills on the floor as soon as it happens. Remove soap buildup in the tub or shower regularly. Attach bath mats securely with double-sided non-slip rug tape. Do not have throw  rugs and other things on the floor that can make you trip. What can I do in the bedroom? Use night lights. Make sure that you have a light by your bed that is easy to reach. Do not use any sheets or blankets that are too big for your bed. They should not hang down onto the floor. Have a firm chair that has side arms. You can use this for support while you get dressed. Do not have throw rugs and other things on the floor that can make you trip. What can I do in the kitchen? Clean up any spills right away. Avoid walking on wet floors. Keep items that you use a lot in easy-to-reach places. If you need to reach something above you, use a strong step stool that has a grab bar. Keep electrical cords out of the way. Do not use floor polish or wax that makes floors slippery. If you must use wax, use non-skid floor wax. Do not have throw rugs and other things on the floor that can make you trip. What can I do with my stairs? Do not leave any items on the stairs. Make sure that there are handrails on both sides of the stairs and use them. Fix handrails that are broken or loose. Make sure that handrails are as long as the stairways. Check any carpeting to make sure that it is firmly attached to the stairs. Fix any carpet that is loose or worn.  Avoid having throw rugs at the top or bottom of the stairs. If you do have throw rugs, attach them to the floor with carpet tape. Make sure that you have a light switch at the top of the stairs and the bottom of the stairs. If you do not have them, ask someone to add them for you. What else can I do to help prevent falls? Wear shoes that: Do not have high heels. Have rubber bottoms. Are comfortable and fit you well. Are closed at the toe. Do not wear sandals. If you use a stepladder: Make sure that it is fully opened. Do not climb a closed stepladder. Make sure that both sides of the stepladder are locked into place. Ask someone to hold it for you, if  possible. Clearly mark and make sure that you can see: Any grab bars or handrails. First and last steps. Where the edge of each step is. Use tools that help you move around (mobility aids) if they are needed. These include: Canes. Walkers. Scooters. Crutches. Turn on the lights when you go into a dark area. Replace any light bulbs as soon as they burn out. Set up your furniture so you have a clear path. Avoid moving your furniture around. If any of your floors are uneven, fix them. If there are any pets around you, be aware of where they are. Review your medicines with your doctor. Some medicines can make you feel dizzy. This can increase your chance of falling. Ask your doctor what other things that you can do to help prevent falls. This information is not intended to replace advice given to you by your health care provider. Make sure you discuss any questions you have with your health care provider. Document Released: 09/11/2009 Document Revised: 04/22/2016 Document Reviewed: 12/20/2014 Elsevier Interactive Patient Education  2017 [employer].
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
## 2021-09-01 LAB — HM DIABETES EYE EXAM

## 2021-09-03 ENCOUNTER — Other Ambulatory Visit: Payer: Self-pay

## 2021-09-03 ENCOUNTER — Encounter: Payer: Self-pay | Admitting: Gastroenterology

## 2021-09-03 ENCOUNTER — Ambulatory Visit (AMBULATORY_SURGERY_CENTER): Payer: Medicare Other | Admitting: Gastroenterology

## 2021-09-03 VITALS — BP 145/89 | HR 69 | Temp 97.3°F | Resp 13 | Ht 73.0 in | Wt 309.0 lb

## 2021-09-03 DIAGNOSIS — K5730 Diverticulosis of large intestine without perforation or abscess without bleeding: Secondary | ICD-10-CM

## 2021-09-03 DIAGNOSIS — D122 Benign neoplasm of ascending colon: Secondary | ICD-10-CM | POA: Diagnosis not present

## 2021-09-03 DIAGNOSIS — Z800 Family history of malignant neoplasm of digestive organs: Secondary | ICD-10-CM | POA: Diagnosis not present

## 2021-09-03 DIAGNOSIS — K5140 Inflammatory polyps of colon without complications: Secondary | ICD-10-CM

## 2021-09-03 DIAGNOSIS — K640 First degree hemorrhoids: Secondary | ICD-10-CM

## 2021-09-03 DIAGNOSIS — Z1211 Encounter for screening for malignant neoplasm of colon: Secondary | ICD-10-CM

## 2021-09-03 DIAGNOSIS — D125 Benign neoplasm of sigmoid colon: Secondary | ICD-10-CM

## 2021-09-03 DIAGNOSIS — D128 Benign neoplasm of rectum: Secondary | ICD-10-CM

## 2021-09-03 MED ORDER — SODIUM CHLORIDE 0.9 % IV SOLN: 500.0000 mL | Freq: Once | INTRAVENOUS | Status: AC

## 2021-09-03 NOTE — Progress Notes (Signed)
Pt stable  Few PVC noted during procedure VSS Report given in RR

## 2021-09-03 NOTE — Patient Instructions (Signed)
Information on polyps, diverticulosis and hemorrhoids given to you today.  Await pathology results.  Resume previous diet and medications.  Repeat colonoscopy in 6 months for surveillance after piecemeal polypectomy.   YOU HAD AN ENDOSCOPIC PROCEDURE TODAY AT [facility]:   Refer to the procedure report that was given to you for any specific questions about what was found during the examination.  If the procedure report does not answer your questions, please call your gastroenterologist to clarify.  If you requested that your care partner not be given the details of your procedure findings, then the procedure report has been included in a sealed envelope for you to review at your convenience later.  YOU SHOULD EXPECT: Some feelings of bloating in the abdomen. Passage of more gas than usual.  Walking can help get rid of the air that was put into your GI tract during the procedure and reduce the bloating. If you had a lower endoscopy (such as a colonoscopy or flexible sigmoidoscopy) you may notice spotting of blood in your stool or on the toilet paper. If you underwent a bowel prep for your procedure, you may not have a normal bowel movement for a few days.  Please Note:  You might notice some irritation and congestion in your nose or some drainage.  This is from the oxygen used during your procedure.  There is no need for concern and it should clear up in a day or so.  SYMPTOMS TO REPORT IMMEDIATELY:  Following lower endoscopy (colonoscopy or flexible sigmoidoscopy):  Excessive amounts of blood in the stool  Significant tenderness or worsening of abdominal pains  Swelling of the abdomen that is new, acute  Fever of 100F or higher   For urgent or emergent issues, a gastroenterologist can be reached at any hour by calling [phone]. Do not use MyChart messaging for urgent concerns.    DIET:  We do recommend a small meal at first, but then you may proceed to your  regular diet.  Drink plenty of fluids but you should avoid alcoholic beverages for 24 hours.  ACTIVITY:  You should plan to take it easy for the rest of today and you should NOT DRIVE or use heavy machinery until tomorrow (because of the sedation medicines used during the test).    FOLLOW UP: Our staff will call the number listed on your records 48-72 hours following your procedure to check on you and address any questions or concerns that you may have regarding the information given to you following your procedure. If we do not reach you, we will leave a message.  We will attempt to reach you two times.  During this call, we will ask if you have developed any symptoms of COVID 19. If you develop any symptoms (ie: fever, flu-like symptoms, shortness of breath, cough etc.) before then, please call [phone].  If you test positive for Covid 19 in the 2 weeks post procedure, please call and report this information to [place].    If any biopsies were taken you will be contacted by phone or by letter within the next 1-3 weeks.  Please call us at [phone] if you have not heard about the biopsies in 3 weeks.    SIGNATURES/CONFIDENTIALITY: You and/or your care partner have signed paperwork which will be entered into your electronic medical record.  These signatures attest to the fact that that the information above on your After Visit Summary has been reviewed and is understood.  Full  responsibility of the confidentiality of this discharge information lies with you and/or your care-partner.  

## 2021-09-03 NOTE — Progress Notes (Signed)
Called to room to assist during endoscopic procedure.  Patient ID and intended procedure confirmed with present staff. Received instructions for my participation in the procedure from the performing physician.  
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
## 2021-09-03 NOTE — Progress Notes (Signed)
Pt's states no medical or surgical changes since previsit or office visit. VS assessed by D.T 

## 2021-09-03 NOTE — Op Note (Signed)
[facility] Patient Name: Tyler Maynard Procedure Date: 09/03/2021 9:53 AM MRN: [id number] Endoscopist: Gerrit Heck , MD Age: 70 Referring MD:  Date of Birth: 12-06-1950 Gender: Male Account #: [id number] Procedure:                Colonoscopy Indications:              Screening in patient at increased risk: Colorectal                            cancer in father 42 or older (diagnosed in his                            21's). This is the patient's first colonoscopy. He                            is otherwise without active GI symptoms. Medicines:                Monitored Anesthesia Care Procedure:                Pre-Anesthesia Assessment:                           - Prior to the procedure, a History and Physical                            was performed, and patient medications and                            allergies were reviewed. The patient's tolerance of                            previous anesthesia was also reviewed. The risks                            and benefits of the procedure and the sedation                            options and risks were discussed with the patient.                            All questions were answered, and informed consent                            was obtained. Prior Anticoagulants: The patient has                            taken no previous anticoagulant or antiplatelet                            agents. ASA Grade Assessment: III - A patient with                            severe systemic disease. After reviewing the risks  and benefits, the patient was deemed in                            satisfactory condition to undergo the procedure.                           After obtaining informed consent, the colonoscope                            was passed under direct vision. Throughout the                            procedure, the patient's blood pressure, pulse, and                            oxygen saturations  were monitored continuously. The                            Colonoscope was introduced through the anus and                            advanced to the the cecum, identified by the                            appendiceal orifice, ileocecal valve and palpation.                            The colonoscopy was technically difficult and                            complex due to significant looping. The patient                            tolerated the procedure well. The quality of the                            bowel preparation was good. The ileocecal valve,                            appendiceal orifice, and rectum were photographed. Scope In: 9:58:05 AM Scope Out: 10:48:03 AM Scope Withdrawal Time: 0 hours 46 minutes 3 seconds  Total Procedure Duration: 0 hours 49 minutes 58 seconds  Findings:                 The perianal and digital rectal examinations were                            normal.                           A 12 mm polyp was found in the proximal ascending                            colon, located immediately distal to (but separate  from) the IC valve. The polyp was sessile. The                            polyp was removed with a cold snare via piecemeal                            technique. Due to difficult positioning and                            significant looping in the right colon, cold                            forceps were then used to resect the edges via                            avulsion technique. Resection and retrieval were                            complete. Estimated blood loss was minimal.                           A 15 mm polyp was found in the distal ascending                            colon. The polyp was sessile. The polyp was removed                            with a cold snare. Resection and retrieval were                            complete. Estimated blood loss was minimal.                           Two sessile polyps were  found in the sigmoid colon.                            The polyps were 3 to 6 mm in size. There located in                            a dense field of diverticulosis. These polyps were                            removed with a cold snare. Resection and retrieval                            were complete. Estimated blood loss was minimal.                           A 3 mm polyp was found in the rectum. The polyp was                            sessile. The  polyp was removed with a cold snare.                            Resection and retrieval were complete. Estimated                            blood loss was minimal.                           Multiple small and large-mouthed diverticula were                            found in the sigmoid colon, descending colon,                            transverse colon and ascending colon.                           Non-bleeding internal hemorrhoids were found during                            retroflexion. The hemorrhoids were small.                           The ascending colon revealed significantly                            excessive looping. Advancing the scope required                            using manual pressure. This made positioning for                            polypectomy of the proximal polyp difficult. Complications:            No immediate complications. Estimated Blood Loss:     Estimated blood loss was minimal. Impression:               - One 12 mm polyp in the proximal ascending colon,                            removed with a cold snare and cold forceps.                            Resected and retrieved.                           - One 15 mm polyp in the distal ascending colon,                            removed with a cold snare. Resected and retrieved.                           - Two 3 to 6 mm polyps in the sigmoid colon,  removed with a cold snare. Resected and retrieved.                           - One 3  mm polyp in the rectum, removed with a cold                            snare. Resected and retrieved.                           - Diverticulosis in the sigmoid colon, in the                            descending colon, in the transverse colon and in                            the ascending colon.                           - Non-bleeding internal hemorrhoids.                           - There was significant looping of the colon. Recommendation:           - Patient has a contact number available for                            emergencies. The signs and symptoms of potential                            delayed complications were discussed with the                            patient. Return to normal activities tomorrow.                            Written discharge instructions were provided to the                            patient.                           - Resume previous diet.                           - Continue present medications.                           - Await pathology results.                           - Repeat colonoscopy in 6 months for surveillance                            after piecemeal polypectomy.                           -  Return to GI office PRN.                           - Use fiber, for example Citrucel, Fibercon, Konsyl                            or Metamucil. Gerrit Heck, MD 09/03/2021 10:57:57 AM

## 2021-09-07 ENCOUNTER — Telehealth: Payer: Self-pay | Admitting: *Deleted

## 2021-09-07 ENCOUNTER — Telehealth: Payer: Self-pay

## 2021-09-07 NOTE — Telephone Encounter (Signed)
No answer, left message to call back later today, B.Ziggy Chanthavong RN. 

## 2021-09-07 NOTE — Telephone Encounter (Signed)
  Follow up Call-  Call back number 09/03/2021  Post procedure Call Back phone  # [phone]  Permission to leave phone message Yes  Some recent data might be hidden     Patient questions:  Do you have a fever, pain , or abdominal swelling? No. Pain Score  0 *  Have you tolerated food without any problems? Yes.    Have you been able to return to your normal activities? Yes.    Do you have any questions about your discharge instructions: Diet   No. Medications  No. Follow up visit  No.  Do you have questions or concerns about your Care? No.  Actions: * If pain score is 4 or above: No action needed, pain <4.  Have you developed a fever since your procedure? no  2.   Have you had an respiratory symptoms (SOB or cough) since your procedure? no  3.   Have you tested positive for COVID 19 since your procedure no  4.   Have you had any family members/close contacts diagnosed with the COVID 19 since your procedure?  no   If yes to any of these questions please route to Joylene John, RN and Joella Prince, RN

## 2021-09-15 ENCOUNTER — Encounter: Payer: Self-pay | Admitting: Gastroenterology

## 2021-09-23 ENCOUNTER — Other Ambulatory Visit: Payer: Self-pay

## 2021-09-23 ENCOUNTER — Ambulatory Visit (INDEPENDENT_AMBULATORY_CARE_PROVIDER_SITE_OTHER): Payer: Medicare Other | Admitting: Medical

## 2021-09-23 ENCOUNTER — Encounter: Payer: Self-pay | Admitting: Medical

## 2021-09-23 VITALS — BP 134/69 | HR 85 | Temp 98.2°F | Resp 18 | Ht 73.0 in | Wt 307.0 lb

## 2021-09-23 DIAGNOSIS — Z23 Encounter for immunization: Secondary | ICD-10-CM | POA: Diagnosis not present

## 2021-09-23 DIAGNOSIS — E119 Type 2 diabetes mellitus without complications: Secondary | ICD-10-CM | POA: Diagnosis not present

## 2021-09-23 DIAGNOSIS — E785 Hyperlipidemia, unspecified: Secondary | ICD-10-CM

## 2021-09-23 DIAGNOSIS — R739 Hyperglycemia, unspecified: Secondary | ICD-10-CM | POA: Diagnosis not present

## 2021-09-23 DIAGNOSIS — K429 Umbilical hernia without obstruction or gangrene: Secondary | ICD-10-CM | POA: Diagnosis not present

## 2021-09-23 NOTE — Addendum Note (Signed)
Addended by: Randolm Idol A on: 09/23/2021 02:20 PM   Modules accepted: Orders

## 2021-09-23 NOTE — Patient Instructions (Addendum)
For diabetes can get a1c mid november   For high cholesterol, will get lipid panel and cmp(future lab). Continue lipitor.   For htn continue current meds amlodipoine and zestoretic.   Glad to see you are now working. Try to limit calories. Not overeat at night  Flu vaccine today.  Referral to general surgeon placed for umbilical hernia. If complication/increased pain as dicussed then ED evaluation.  Future labs to be done middle middle of November week of 14 th.  Follow up  in late february or sooner if needed.  Umbilical Hernia, Adult A hernia is a bulge of tissue that pushes through an opening between muscles. An umbilical hernia happens in the abdomen, near the belly button (umbilicus). The hernia may contain tissues from the small intestine, large intestine, or fatty tissue covering the intestines (omentum). Umbilical hernias in adults tend to get worse over time, and they require surgical treatment. There are several types of umbilical hernias. You may have: A hernia located just above or below the umbilicus (indirect hernia). This is the most common type of umbilical hernia in adults. A hernia that forms through an opening formed by the umbilicus (direct hernia). A hernia that comes and goes (reducible hernia). A reducible hernia may be visible only when you strain, lift something heavy, or cough. This type of hernia can be pushed back into the abdomen (reduced). A hernia that traps abdominal tissue inside the hernia (incarcerated hernia). This type of hernia cannot be reduced. A hernia that cuts off blood flow to the tissues inside the hernia (strangulated hernia). The tissues can start to die if this happens. This type of hernia requires emergency treatment. What are the causes? An umbilical hernia happens when tissue inside the abdomen presses on a weak area of the abdominal muscles. What increases the risk? You may have a greater risk of this condition if you: Are obese. Have  had several pregnancies. Have a buildup of fluid inside your abdomen (ascites). Have had surgery that weakens the abdominal muscles. What are the signs or symptoms? The main symptom of this condition is a painless bulge at or near the belly button. A reducible hernia may be visible only when you strain, lift something heavy, or cough. Other symptoms may include: Dull pain. A feeling of pressure. Symptoms of a strangulated hernia may include: Pain that gets increasingly worse. Nausea and vomiting. Pain when pressing on the hernia. Skin over the hernia becoming red or purple. Constipation. Blood in the stool. How is this diagnosed? This condition may be diagnosed based on: A physical exam. You may be asked to cough or strain while standing. These actions increase the pressure inside your abdomen and force the hernia through the opening in your muscles. Your health care provider may try to reduce the hernia by pressing on it. Your symptoms and medical history. How is this treated? Surgery is the only treatment for an umbilical hernia. Surgery for a strangulated hernia is done as soon as possible. If you have a small hernia that is not incarcerated, you may need to lose weight before having surgery. Follow these instructions at home: Lose weight, if told by your health care provider. Do not try to push the hernia back in. Watch your hernia for any changes in color or size. Tell your health care provider if any changes occur. You may need to avoid activities that increase pressure on your hernia. Do not lift anything that is heavier than 10 lb (4.5 kg) until your  health care provider says that this is safe. Take over-the-counter and prescription medicines only as told by your health care provider. Keep all follow-up visits as told by your health care provider. This is important. Contact a health care provider if: Your hernia gets larger. Your hernia becomes painful. Get help right away  if: You develop sudden, severe pain near the area of your hernia. You have pain as well as nausea or vomiting. You have pain and the skin over your hernia changes color. You develop a fever. This information is not intended to replace advice given to you by your health care provider. Make sure you discuss any questions you have with your health care provider. Document Revised: 12/28/2017 Document Reviewed: 05/16/2017 Elsevier Patient Education  [place].
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
## 2021-10-12 ENCOUNTER — Other Ambulatory Visit: Payer: Self-pay

## 2021-10-12 ENCOUNTER — Other Ambulatory Visit (INDEPENDENT_AMBULATORY_CARE_PROVIDER_SITE_OTHER): Payer: Medicare Other

## 2021-10-12 DIAGNOSIS — E119 Type 2 diabetes mellitus without complications: Secondary | ICD-10-CM | POA: Diagnosis not present

## 2021-10-12 DIAGNOSIS — E785 Hyperlipidemia, unspecified: Secondary | ICD-10-CM | POA: Diagnosis not present

## 2021-10-12 DIAGNOSIS — R739 Hyperglycemia, unspecified: Secondary | ICD-10-CM | POA: Diagnosis not present

## 2021-10-13 LAB — COMPREHENSIVE METABOLIC PANEL
ALT: 28 U/L (ref 0–53)
AST: 38 U/L — ABNORMAL HIGH (ref 0–37)
Albumin: 4.4 g/dL (ref 3.5–5.2)
Alkaline Phosphatase: 70 U/L (ref 39–117)
BUN: 16 mg/dL (ref 6–23)
CO2: 30 mEq/L (ref 19–32)
Calcium: 10.1 mg/dL (ref 8.4–10.5)
Chloride: 95 mEq/L — ABNORMAL LOW (ref 96–112)
Creatinine, Ser: 0.9 mg/dL (ref 0.40–1.50)
GFR: 87.46 mL/min (ref >60.00)
Glucose, Bld: 96 mg/dL (ref 70–99)
Potassium: 3.9 mEq/L (ref 3.5–5.1)
Sodium: 135 mEq/L (ref 135–145)
Total Bilirubin: 1 mg/dL (ref 0.2–1.2)
Total Protein: 7.5 g/dL (ref 6.0–8.3)

## 2021-10-13 LAB — LIPID PANEL
Cholesterol: 186 mg/dL (ref 0–200)
HDL: 38.7 mg/dL — ABNORMAL LOW (ref >39.00)
LDL Cholesterol: 125 mg/dL — ABNORMAL HIGH (ref 0–99)
NonHDL: 147.4
Total CHOL/HDL Ratio: 5
Triglycerides: 114 mg/dL (ref 0.0–149.0)
VLDL: 22.8 mg/dL (ref 0.0–40.0)

## 2021-10-13 LAB — HEMOGLOBIN A1C: Hgb A1c MFr Bld: 6.9 % — ABNORMAL HIGH (ref 4.6–6.5)

## 2021-10-14 ENCOUNTER — Other Ambulatory Visit: Payer: Self-pay | Admitting: Medical

## 2021-10-20 ENCOUNTER — Other Ambulatory Visit: Payer: Self-pay | Admitting: Medical

## 2021-11-03 ENCOUNTER — Other Ambulatory Visit: Payer: Self-pay | Admitting: Medical

## 2021-11-24 ENCOUNTER — Other Ambulatory Visit: Payer: Self-pay | Admitting: Medical

## 2021-12-29 ENCOUNTER — Other Ambulatory Visit: Payer: Self-pay | Admitting: Medical

## 2022-01-14 ENCOUNTER — Other Ambulatory Visit: Payer: Self-pay | Admitting: Medical

## 2022-01-14 ENCOUNTER — Other Ambulatory Visit: Payer: Self-pay

## 2022-01-14 MED ORDER — MONTELUKAST SODIUM 10 MG PO TABS: 10.0000 mg | ORAL_TABLET | Freq: Every day | ORAL | 3 refills | Status: DC

## 2022-02-02 ENCOUNTER — Ambulatory Visit: Payer: Medicare Other | Admitting: Medical

## 2022-02-03 ENCOUNTER — Encounter: Payer: Self-pay | Admitting: Medical

## 2022-02-03 ENCOUNTER — Ambulatory Visit (INDEPENDENT_AMBULATORY_CARE_PROVIDER_SITE_OTHER): Payer: Medicare Other | Admitting: Medical

## 2022-02-03 VITALS — BP 132/80 | HR 73 | Temp 98.2°F | Resp 20 | Ht 73.0 in | Wt 314.0 lb

## 2022-02-03 DIAGNOSIS — M545 Low back pain, unspecified: Secondary | ICD-10-CM

## 2022-02-03 DIAGNOSIS — E119 Type 2 diabetes mellitus without complications: Secondary | ICD-10-CM

## 2022-02-03 DIAGNOSIS — M79671 Pain in right foot: Secondary | ICD-10-CM

## 2022-02-03 DIAGNOSIS — I10 Essential (primary) hypertension: Secondary | ICD-10-CM

## 2022-02-03 DIAGNOSIS — E785 Hyperlipidemia, unspecified: Secondary | ICD-10-CM

## 2022-02-03 DIAGNOSIS — M79672 Pain in left foot: Secondary | ICD-10-CM
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
## 2022-02-03 NOTE — Patient Instructions (Addendum)
For diabetes a1c today. Decide if med changes need to be made on review. ?  ?For high cholesterol, will get lipid panel and cmp(future lab). Continue lipitor. ?  ?For htn continue current meds amlodipoine and zestoretic. Bp better on recheck. ?  ?Glad to see you are still  working. Try to limit calories. Not to overeat at night ?  ?Follow up  in late June  or sooner if needed. ?

## 2022-02-04 LAB — COMPREHENSIVE METABOLIC PANEL
ALT: 29 U/L (ref 0–53)
AST: 38 U/L — ABNORMAL HIGH (ref 0–37)
Albumin: 4.1 g/dL (ref 3.5–5.2)
Alkaline Phosphatase: 65 U/L (ref 39–117)
BUN: 15 mg/dL (ref 6–23)
CO2: 27 mEq/L (ref 19–32)
Calcium: 9.9 mg/dL (ref 8.4–10.5)
Chloride: 99 mEq/L (ref 96–112)
Creatinine, Ser: 0.79 mg/dL (ref 0.40–1.50)
GFR: 90.77 mL/min (ref >60.00)
Glucose, Bld: 86 mg/dL (ref 70–99)
Potassium: 3.7 mEq/L (ref 3.5–5.1)
Sodium: 136 mEq/L (ref 135–145)
Total Bilirubin: 1.1 mg/dL (ref 0.2–1.2)
Total Protein: 7.4 g/dL (ref 6.0–8.3)

## 2022-02-04 LAB — HEMOGLOBIN A1C: Hgb A1c MFr Bld: 6.7 % — ABNORMAL HIGH (ref 4.6–6.5)

## 2022-02-04 LAB — LIPID PANEL
Cholesterol: 153 mg/dL (ref 0–200)
HDL: 36.9 mg/dL — ABNORMAL LOW (ref >39.00)
LDL Cholesterol: 90 mg/dL (ref 0–99)
NonHDL: 116.05
Total CHOL/HDL Ratio: 4
Triglycerides: 129 mg/dL (ref 0.0–149.0)
VLDL: 25.8 mg/dL (ref 0.0–40.0)

## 2022-02-09 ENCOUNTER — Other Ambulatory Visit: Payer: Self-pay

## 2022-02-09 ENCOUNTER — Telehealth: Payer: Self-pay | Admitting: Medical

## 2022-02-09 ENCOUNTER — Ambulatory Visit (HOSPITAL_BASED_OUTPATIENT_CLINIC_OR_DEPARTMENT_OTHER)
Admission: RE | Admit: 2022-02-09 | Discharge: 2022-02-09 | Disposition: A | Discharge status: Home / Self Care | Payer: Medicare Other | Source: Ambulatory Visit | Attending: Medical | Admitting: Medical

## 2022-02-09 DIAGNOSIS — M545 Low back pain, unspecified: Secondary | ICD-10-CM | POA: Insufficient documentation

## 2022-02-09 NOTE — Telephone Encounter (Signed)
Pt states his pain is back on his side and he had talked to [facility] about getting xrays done. He would like to know when those orders have been placed. Please advise.  ?

## 2022-02-09 NOTE — Addendum Note (Signed)
Addended by: Anabel Halon on: 02/09/2022 01:06 PM ? ? Modules accepted: Orders ? ?

## 2022-02-11 ENCOUNTER — Ambulatory Visit (HOSPITAL_BASED_OUTPATIENT_CLINIC_OR_DEPARTMENT_OTHER)
Admission: RE | Admit: 2022-02-11 | Discharge: 2022-02-11 | Disposition: A | Discharge status: Home / Self Care | Payer: Medicare Other | Source: Ambulatory Visit | Attending: Medical | Admitting: Medical

## 2022-02-11 ENCOUNTER — Telehealth: Payer: Self-pay

## 2022-02-11 ENCOUNTER — Other Ambulatory Visit: Payer: Self-pay

## 2022-02-11 DIAGNOSIS — M79672 Pain in left foot: Secondary | ICD-10-CM | POA: Diagnosis present

## 2022-02-11 DIAGNOSIS — M79671 Pain in right foot: Secondary | ICD-10-CM | POA: Insufficient documentation

## 2022-02-11 NOTE — Addendum Note (Signed)
Addended by: Anabel Halon on: 02/11/2022 12:49 PM ? ? Modules accepted: Orders ? ?

## 2022-02-11 NOTE — Telephone Encounter (Signed)
SWP, he is aware. ?

## 2022-02-11 NOTE — Telephone Encounter (Signed)
Pt called requesting PCP to put in request for an x-ray for bilat foot pain.  He stated he called the other day for an x-ray and PCP put it in without having to be seen and is now wanting to have it done for his feet.  I advised PCP may want pt to be seen.  Please advise. ?

## 2022-02-16 ENCOUNTER — Telehealth: Payer: Self-pay | Admitting: Medical

## 2022-02-16 NOTE — Telephone Encounter (Signed)
Pt spoke with [name] regarding foot pain and was informed that he would be seen by a podiatrist. No referrals sent. Please advise.  ?

## 2022-02-16 NOTE — Addendum Note (Signed)
Addended by: Anabel Halon on: 02/16/2022 05:55 PM ? ? Modules accepted: Orders ? ?

## 2022-02-18 ENCOUNTER — Ambulatory Visit: Payer: Medicare Other | Admitting: Podiatry

## 2022-03-04 ENCOUNTER — Ambulatory Visit: Payer: Medicare Other

## 2022-03-04 ENCOUNTER — Ambulatory Visit: Payer: Medicare Other | Admitting: Podiatry

## 2022-03-04 DIAGNOSIS — M109 Gout, unspecified: Secondary | ICD-10-CM

## 2022-03-04 DIAGNOSIS — M7750 Other enthesopathy of unspecified foot: Secondary | ICD-10-CM

## 2022-03-07 NOTE — Progress Notes (Signed)
?  Subjective:  ?Patient ID: Tyler Maynard, male    DOB: May 24, 1951,  MRN: [id number] ? ?Chief Complaint  ?Patient presents with  ? Foot Pain  ?  (xray)(np) left foot severe pain-swelling/ bil arthritis/heel spurs  ? ? ?71 y.o. male presents with the above complaint. History confirmed with patient.  He had severe pain that developed a few weeks ago.  He had x-rays done by his PCP on 02/11/2022.  Very severe and hard so severe that he cannot have a on it for 3 days at a time and goes away.  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  He says he eats meat dairy products rinks alcohol likes shellfish. ? ?Objective:  ?Physical Exam: ?warm, good capillary refill, no trophic changes or ulcerative lesions, normal DP and PT pulses, normal sensory exam, and minimal pain or swelling today in the hindfoot or forefoot on either side. ? ?Assessment:  ? ?1. Acute gout of left foot, unspecified cause   ? ? ? ?Plan:  ?Patient was evaluated and treated and all questions answered. ? ?Today clinically he had little to no swelling or pain that required immediate attention.  I discussed with him that based on his clinical history it sounds like he is having gout attacks and I reviewed the etiology and treatment options of gout including acute and chronic flares.  I discussed how dietary choices contribute to this and he has often eaten quite a few things that contribute to this.  I gave him information on a low purine diet.  I also gave him orders for lab work including uric acid and basic meta panel and CBC to be taken if he has another flare he will keep these on hand and go to [facility] to get these done ASAP if his pain returns. ? ?Return if symptoms worsen or fail to improve.  ? ?

## 2022-03-17 ENCOUNTER — Other Ambulatory Visit: Payer: Self-pay | Admitting: Medical

## 2022-03-26 ENCOUNTER — Other Ambulatory Visit: Payer: Self-pay | Admitting: Medical

## 2022-04-08 ENCOUNTER — Ambulatory Visit (AMBULATORY_SURGERY_CENTER): Payer: Medicare Other | Admitting: *Deleted

## 2022-04-08 VITALS — Ht 72.0 in | Wt 311.0 lb

## 2022-04-08 DIAGNOSIS — Z86010 Personal history of colonic polyps: Secondary | ICD-10-CM

## 2022-04-08 NOTE — Progress Notes (Signed)
No egg or soy allergy known to patient  ?No issues known to pt with past sedation with any surgeries or procedures ?Patient denies ever being told they had issues or difficulty with intubation  ?No FH of Malignant Hyperthermia ?Pt is not on diet pills ?Pt is not on  home 02  ?Pt is not on blood thinners  ?Pt denies issues with constipation  ?No A fib or A flutter ?Pt concerned about prep cost, stated last time insurance wouldn't cover cost and pharmacy would not take coupons. Offered a sample of plenvu, pt said he could come by today and pick up. New instructions given for Plenvu, will leave prep and copy of instructions for pt to pick up today. ? ?PV completed over the phone. Pt verified name, DOB, address and insurance during PV today.  ?Pt mailed instruction packet with copy of consent form to read and not return, and instructions.  ?Pt encouraged to call with questions or issues.  ? ?Insurance confirmed with pt at [facility] today   ?

## 2022-04-08 NOTE — Patient Instructions (Signed)
lec

## 2022-04-16 ENCOUNTER — Ambulatory Visit (INDEPENDENT_AMBULATORY_CARE_PROVIDER_SITE_OTHER): Payer: Medicare Other | Admitting: Medical

## 2022-04-16 ENCOUNTER — Encounter: Payer: Self-pay | Admitting: Medical

## 2022-04-16 ENCOUNTER — Telehealth: Payer: Self-pay

## 2022-04-16 VITALS — BP 140/80 | HR 85 | Temp 98.0°F | Resp 18 | Ht 72.0 in | Wt 314.0 lb

## 2022-04-16 DIAGNOSIS — R0789 Other chest pain: Secondary | ICD-10-CM

## 2022-04-16 DIAGNOSIS — M25512 Pain in left shoulder: Secondary | ICD-10-CM

## 2022-04-16 DIAGNOSIS — I10 Essential (primary) hypertension: Secondary | ICD-10-CM

## 2022-04-16 DIAGNOSIS — S46811A Strain of other muscles, fascia and tendons at shoulder and upper arm level, right arm, initial encounter: Secondary | ICD-10-CM | POA: Diagnosis not present

## 2022-04-16 DIAGNOSIS — E785 Hyperlipidemia, unspecified: Secondary | ICD-10-CM | POA: Diagnosis not present

## 2022-04-16 DIAGNOSIS — E119 Type 2 diabetes mellitus without complications: Secondary | ICD-10-CM

## 2022-04-16 LAB — BASIC METABOLIC PANEL
BUN/Creatinine Ratio: 19 (ref 10–24)
BUN: 18 mg/dL (ref 8–27)
CO2: 25 mmol/L (ref 20–29)
Calcium: 10.3 mg/dL — ABNORMAL HIGH (ref 8.6–10.2)
Chloride: 97 mmol/L (ref 96–106)
Creatinine, Ser: 0.95 mg/dL (ref 0.76–1.27)
Glucose: 119 mg/dL — ABNORMAL HIGH (ref 70–99)
Potassium: 4.1 mmol/L (ref 3.5–5.2)
Sodium: 139 mmol/L (ref 134–144)
eGFR: 87 mL/min/{1.73_m2} (ref >59)

## 2022-04-16 LAB — URIC ACID: Uric Acid: 9.8 mg/dL — ABNORMAL HIGH (ref 3.8–8.4)

## 2022-04-16 LAB — TROPONIN I: Troponin I: 4 ng/L (ref <=47)

## 2022-04-16 MED ORDER — CYCLOBENZAPRINE HCL 5 MG PO TABS: 5.0000 mg | ORAL_TABLET | Freq: Every day | ORAL | 0 refills | Status: DC

## 2022-04-16 MED ORDER — KETOROLAC TROMETHAMINE 30 MG/ML IJ SOLN
30.0000 mg | Freq: Once | INTRAMUSCULAR | Status: AC
  Administered 2022-04-16: 30 mg via INTRAMUSCULAR

## 2022-04-16 NOTE — Telephone Encounter (Signed)
Patient Name: Tyler Maynard Gender: Male DOB: 1951-03-30 Age: 71 Y 65 M 4 D [employer] investment [employer] [facility] - Client Client Site [facility] - Night Provider [place], [place] - [place] Contact Type Call Who Is Calling Patient / Member / Family / Caregiver Call Type Triage / Clinical Relationship To Patient Self Return Phone Number [phone] (Primary) Chief Complaint CHEST PAIN - pain, pressure, heaviness or tightness Reason for Call Symptomatic / Request for [place] states for a week he has been having a pain in the back of his neck and it travels to his left shoulder and now he is feeling the pain down his back and every now and then it causes pain in his chest and he also said when he blows his nose he feels pain. He says it feels like muscle spasms. Translation No Nurse Assessment Nurse: Geraldine Contras, RN, Jordan Hawks Date/Time (Eastern Time): 04/15/2022 7:58:45 PM Confirm and document reason for call. If symptomatic, describe symptoms. ---Caller states 3-4 days ago thinks he might have strained "something" he has been having a pain in the back of his neck and it travels to his left shoulder and now upper shoulder back. He says it feels like muscle spasms. Sitting on heating pad. Had chest wall pain but it has subsided it is mostly back and neck. He is a Administrator and also lift heavy things . The days go on and the pain is still there. Tylenol has helped. Does the patient have any new or worsening symptoms? ---Yes Will a triage be completed? ---Yes Related visit to physician within the last 2 weeks? ---No Does the PT have any chronic conditions? (i.e. diabetes, asthma, this includes High risk factors for pregnancy, etc.) ---Yes List chronic conditions. ---HTN, high cholesterol, border line DM Is this a behavioral health or substance abuse call? ---N
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
## 2022-04-16 NOTE — Telephone Encounter (Signed)
Pt in today for appointment

## 2022-04-16 NOTE — Addendum Note (Signed)
Addended by: Jeronimo Greaves on: 04/16/2022 03:17 PM   Modules accepted: Orders

## 2022-04-16 NOTE — Patient Instructions (Signed)
Trapezius pain, shoulder pain and chest pain.  Onset of pain occurred when attempting to stop a heavy freezer from following over.  Following day muscle pain more prominent.  We will give Toradol 30 mg IM injection.  I will also have the start ibuprofen 200 to 400 mg every 8 hours tomorrow morning in combination with tylenol over the counter.  In addition will prescribe Flexeril 5 mg tablet to use at night.  EKG done today for caution sake due to the fact that you do have cardiovascular risk factors that being hypertension, hyperlipidemia and diabetes.  Your EKG showed normal sinus rhythm.  As we approached the weekend we will get 1 set of troponin.  However if test is negative and you have worsening signs and symptoms particularly constant chest pain that have to recommend that you be evaluated in the emergency department.  Also sometimes when getting troponin levels through our office the result is delayed.  This happens very rarely but if that were to occur then I would need for you to be seen in the emergency department.  Follow-up in 1 week or sooner if needed.

## 2022-04-20 ENCOUNTER — Encounter: Payer: Self-pay | Admitting: Gastroenterology

## 2022-04-20 ENCOUNTER — Other Ambulatory Visit: Payer: Self-pay | Admitting: Medical

## 2022-04-22 ENCOUNTER — Encounter: Payer: Self-pay | Admitting: Gastroenterology

## 2022-04-22 ENCOUNTER — Ambulatory Visit (AMBULATORY_SURGERY_CENTER): Payer: Medicare Other | Admitting: Gastroenterology

## 2022-04-22 VITALS — BP 162/92 | HR 68 | Temp 98.4°F | Resp 15 | Ht 73.0 in | Wt 311.0 lb

## 2022-04-22 DIAGNOSIS — K639 Disease of intestine, unspecified: Secondary | ICD-10-CM

## 2022-04-22 DIAGNOSIS — K5730 Diverticulosis of large intestine without perforation or abscess without bleeding: Secondary | ICD-10-CM

## 2022-04-22 DIAGNOSIS — Z800 Family history of malignant neoplasm of digestive organs: Secondary | ICD-10-CM

## 2022-04-22 DIAGNOSIS — K635 Polyp of colon: Secondary | ICD-10-CM | POA: Diagnosis not present

## 2022-04-22 DIAGNOSIS — D125 Benign neoplasm of sigmoid colon: Secondary | ICD-10-CM | POA: Diagnosis not present

## 2022-04-22 DIAGNOSIS — Z86010 Personal history of colonic polyps: Secondary | ICD-10-CM | POA: Diagnosis not present

## 2022-04-22 DIAGNOSIS — K640 First degree hemorrhoids: Secondary | ICD-10-CM

## 2022-04-22 MED ORDER — SODIUM CHLORIDE 0.9 % IV SOLN: 500.0000 mL | Freq: Once | INTRAVENOUS | Status: DC

## 2022-04-22 NOTE — Patient Instructions (Signed)
Please read handouts provided. Continue present medications. Await pathology results. Repeat colonoscopy in 3 years for screening. Return to GI office as needed.   YOU HAD AN ENDOSCOPIC PROCEDURE TODAY AT [facility]:   Refer to the procedure report that was given to you for any specific questions about what was found during the examination.  If the procedure report does not answer your questions, please call your gastroenterologist to clarify.  If you requested that your care partner not be given the details of your procedure findings, then the procedure report has been included in a sealed envelope for you to review at your convenience later.  YOU SHOULD EXPECT: Some feelings of bloating in the abdomen. Passage of more gas than usual.  Walking can help get rid of the air that was put into your GI tract during the procedure and reduce the bloating. If you had a lower endoscopy (such as a colonoscopy or flexible sigmoidoscopy) you may notice spotting of blood in your stool or on the toilet paper. If you underwent a bowel prep for your procedure, you may not have a normal bowel movement for a few days.  Please Note:  You might notice some irritation and congestion in your nose or some drainage.  This is from the oxygen used during your procedure.  There is no need for concern and it should clear up in a day or so.  SYMPTOMS TO REPORT IMMEDIATELY:  Following lower endoscopy (colonoscopy or flexible sigmoidoscopy):  Excessive amounts of blood in the stool  Significant tenderness or worsening of abdominal pains  Swelling of the abdomen that is new, acute  Fever of 100F or higher   For urgent or emergent issues, a gastroenterologist can be reached at any hour by calling [phone]. Do not use MyChart messaging for urgent concerns.    DIET:  We do recommend a small meal at first, but then you may proceed to your regular diet.  Drink plenty of fluids but you should avoid  alcoholic beverages for 24 hours.  ACTIVITY:  You should plan to take it easy for the rest of today and you should NOT DRIVE or use heavy machinery until tomorrow (because of the sedation medicines used during the test).    FOLLOW UP: Our staff will call the number listed on your records 48-72 hours following your procedure to check on you and address any questions or concerns that you may have regarding the information given to you following your procedure. If we do not reach you, we will leave a message.  We will attempt to reach you two times.  During this call, we will ask if you have developed any symptoms of COVID 19. If you develop any symptoms (ie: fever, flu-like symptoms, shortness of breath, cough etc.) before then, please call [phone].  If you test positive for Covid 19 in the 2 weeks post procedure, please call and report this information to [place].    If any biopsies were taken you will be contacted by phone or by letter within the next 1-3 weeks.  Please call us at [phone] if you have not heard about the biopsies in 3 weeks.    SIGNATURES/CONFIDENTIALITY: You and/or your care partner have signed paperwork which will be entered into your electronic medical record.  These signatures attest to the fact that that the information above on your After Visit Summary has been reviewed and is understood.  Full responsibility of the confidentiality of this discharge information  lies with you and/or your care-partner.  

## 2022-04-22 NOTE — Progress Notes (Signed)
Called to room to assist during endoscopic procedure.  Patient ID and intended procedure confirmed with present staff. Received instructions for my participation in the procedure from the performing physician.  

## 2022-04-22 NOTE — Op Note (Signed)
[facility] Patient Name: Tyler Maynard Procedure Date: 04/22/2022 10:31 AM MRN: [id number] Endoscopist: Gerrit Heck , MD Age: 71 Referring MD:  Date of Birth: [birth year] Gender: Male Account #: [id number] Procedure:                Colonoscopy Indications:              Surveillance: Personal history of piecemeal removal                            of adenoma on last colonoscopy (less than 1 year                            ago)                           Family history notable for father with colon cancer                            diagnosed in his 38s; died at age 42 with colon                            cancer.                           Colonoscopy in 08/2021: 12 mm polyp in proximal                            ascending colon located immediately distal to IC                            valve removed with cold snare via piecemeal                            technique then cold forceps were used to further                            resect the edges via avulsion technique (path: TA).                            15 mm distal a sending polyp (path: TA), 2 small                            3-6 mm sigmoid polyps located in dense field of                            diverticulosis removed with cold snare (path: TA                            x1), 3 mm benign rectal inflammatory polyp.                            Pandiverticulosis, internal hemorrhoids. Excessive  looping and difficult positioning. Medicines:                Monitored Anesthesia Care Procedure:                Pre-Anesthesia Assessment:                           - Prior to the procedure, a History and Physical                            was performed, and patient medications and                            allergies were reviewed. The patient's tolerance of                            previous anesthesia was also reviewed. The risks                            and benefits of the procedure and the  sedation                            options and risks were discussed with the patient.                            All questions were answered, and informed consent                            was obtained. Prior Anticoagulants: The patient has                            taken no previous anticoagulant or antiplatelet                            agents. ASA Grade Assessment: III - A patient with                            severe systemic disease. After reviewing the risks                            and benefits, the patient was deemed in                            satisfactory condition to undergo the procedure.                           After obtaining informed consent, the colonoscope                            was passed under direct vision. Throughout the                            procedure, the patient's blood pressure, pulse, and  oxygen saturations were monitored continuously. The                            Olympus CF-HQ190L [id number]) Colonoscope was                            introduced through the anus and advanced to the the                            cecum, identified by the appendiceal orifice, IC                            valve and transillumination. The colonoscopy was                            technically difficult and complex due to                            significant looping. Successful completion of the                            procedure was aided by applying abdominal pressure.                            The patient tolerated the procedure well. The                            quality of the bowel preparation was good. The                            ileocecal valve, appendiceal orifice, and rectum                            were photographed. Scope In: 10:54:48 AM Scope Out: 11:29:56 AM Scope Withdrawal Time: 0 hours 27 minutes 1 second  Total Procedure Duration: 0 hours 35 minutes 8 seconds  Findings:                 The perianal and  digital rectal examinations were                            normal.                           Two small angioectasias without bleeding were found                            in the ascending colon.                           Two sessile polyps were found in the sigmoid colon.                            The polyps were 3 to 4 mm in size. One was located  adjacent to a diverticulum, but not invading the                            diverticulum. These polyps were removed with a cold                            snare. Resection and retrieval were complete.                            Estimated blood loss was minimal.                           A localized area of mildly erythematous mucosa was                            found in the sigmoid colon, located 45-55 cm from                            the anal verge. This was located in a dense field                            of diverticulosis. Biopsies were taken with a cold                            forceps for histology. Estimated blood loss was                            minimal.                           Four sessile polyps were found in the distal                            sigmoid colon. The polyps were 2 to 5 mm in size.                            These polyps were removed with a cold snare.                            Resection and retrieval were complete. Estimated                            blood loss was minimal.                           Multiple small and large-mouthed diverticula were                            found in the entire colon.                           Non-bleeding internal hemorrhoids were found during  retroflexion. The hemorrhoids were small.                           The ascending colon revealed significantly                            excessive looping. Advancing the scope required                            using manual pressure. The colonoscope was advanced                             through the ascending colon several times, and no                            evidence of residual adenoma at the prior larger                            polypectomy sites. Complications:            No immediate complications. Estimated Blood Loss:     Estimated blood loss was minimal. Impression:               - Two non-bleeding colonic angioectasias.                           - Two 3 to 4 mm polyps in the sigmoid colon,                            removed with a cold snare. Resected and retrieved.                           - Erythematous mucosa in the sigmoid colon.                            Biopsied.                           - Four 2 to 5 mm polyps in the distal sigmoid                            colon, removed with a cold snare. Resected and                            retrieved.                           - Diverticulosis in the entire examined colon.                           - Non-bleeding internal hemorrhoids.                           - There was significant looping of the colon. Recommendation:           - Patient has a contact number available for  emergencies. The signs and symptoms of potential                            delayed complications were discussed with the                            patient. Return to normal activities tomorrow.                            Written discharge instructions were provided to the                            patient.                           - Resume previous diet.                           - Continue present medications.                           - Await pathology results.                           - Repeat colonoscopy in 3 years for surveillance.                           - Return to GI office PRN. Gerrit Heck, MD 04/22/2022 11:40:42 AM
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
## 2022-04-22 NOTE — Progress Notes (Signed)
To pacu, VSS. Report to [email or web address] 

## 2022-04-23 ENCOUNTER — Ambulatory Visit: Payer: Medicare Other | Admitting: Medical

## 2022-04-23 ENCOUNTER — Telehealth: Payer: Self-pay | Admitting: *Deleted

## 2022-04-23 NOTE — Telephone Encounter (Signed)
Patient returned the call stating he was doing great after his procedure and would call if any issues arose.

## 2022-04-23 NOTE — Telephone Encounter (Signed)
  Follow up Call-     04/22/2022   10:20 AM 09/03/2021    8:45 AM  Call back number  Post procedure Call Back phone  # [phone]  Permission to leave phone message Yes Yes    LMOM

## 2022-04-23 NOTE — Telephone Encounter (Signed)
  Follow up Call-     04/22/2022   10:20 AM 09/03/2021    8:45 AM  Call back number  Post procedure Call Back phone  # [phone]  Permission to leave phone message Yes Yes   LMOM to call back with any questions or concerns.

## 2022-04-27 ENCOUNTER — Encounter: Payer: Self-pay | Admitting: Medical

## 2022-04-27 ENCOUNTER — Ambulatory Visit (INDEPENDENT_AMBULATORY_CARE_PROVIDER_SITE_OTHER): Payer: Medicare Other | Admitting: Medical

## 2022-04-27 VITALS — BP 135/70 | HR 83 | Temp 97.9°F | Resp 16 | Ht 72.0 in | Wt 320.0 lb

## 2022-04-27 DIAGNOSIS — M25512 Pain in left shoulder: Secondary | ICD-10-CM | POA: Diagnosis not present

## 2022-04-27 DIAGNOSIS — I10 Essential (primary) hypertension: Secondary | ICD-10-CM

## 2022-04-27 DIAGNOSIS — E785 Hyperlipidemia, unspecified: Secondary | ICD-10-CM

## 2022-04-27 DIAGNOSIS — E119 Type 2 diabetes mellitus without complications: Secondary | ICD-10-CM | POA: Diagnosis not present

## 2022-04-27 DIAGNOSIS — S46811A Strain of other muscles, fascia and tendons at shoulder and upper arm level, right arm, initial encounter: Secondary | ICD-10-CM
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
## 2022-04-27 NOTE — Patient Instructions (Addendum)
Trapezius strain, shoulder pain and pectoralis pain resolved. No further treatment needed.  If pain in above areas reoccur let us know.  If chest pain/pectoralis pain be seen same day in office, UC or ED.  Htn- on recheck bp controlled. Continue amlodipine 2.5 mg daily and zestoretic 20-25 1 tab daily.   Future labs for chronic meds placed.  Follow up end of June after future labs done for chronic med problems.

## 2022-05-05 ENCOUNTER — Encounter: Payer: Self-pay | Admitting: Gastroenterology

## 2022-05-10 ENCOUNTER — Other Ambulatory Visit: Payer: Self-pay | Admitting: Medical

## 2022-05-12 ENCOUNTER — Other Ambulatory Visit (INDEPENDENT_AMBULATORY_CARE_PROVIDER_SITE_OTHER): Payer: Medicare Other

## 2022-05-12 ENCOUNTER — Other Ambulatory Visit: Payer: Medicare Other

## 2022-05-12 DIAGNOSIS — E119 Type 2 diabetes mellitus without complications: Secondary | ICD-10-CM | POA: Diagnosis not present

## 2022-05-12 DIAGNOSIS — I10 Essential (primary) hypertension: Secondary | ICD-10-CM | POA: Diagnosis not present

## 2022-05-12 DIAGNOSIS — E785 Hyperlipidemia, unspecified: Secondary | ICD-10-CM

## 2022-05-13 LAB — CBC WITH DIFFERENTIAL/PLATELET
Basophils Absolute: 0 10*3/uL (ref 0.0–0.1)
Basophils Relative: 0.7 % (ref 0.0–3.0)
Eosinophils Absolute: 0.3 10*3/uL (ref 0.0–0.7)
Eosinophils Relative: 6.8 % — ABNORMAL HIGH (ref 0.0–5.0)
HCT: 41.5 % (ref 39.0–52.0)
Hemoglobin: 13.6 g/dL (ref 13.0–17.0)
Lymphocytes Relative: 30.1 % (ref 12.0–46.0)
Lymphs Abs: 1.5 10*3/uL (ref 0.7–4.0)
MCHC: 32.8 g/dL (ref 30.0–36.0)
MCV: 84 fl (ref 78.0–100.0)
Monocytes Absolute: 0.6 10*3/uL (ref 0.1–1.0)
Monocytes Relative: 13.1 % — ABNORMAL HIGH (ref 3.0–12.0)
Neutro Abs: 2.4 10*3/uL (ref 1.4–7.7)
Neutrophils Relative %: 49.3 % (ref 43.0–77.0)
Platelets: 265 10*3/uL (ref 150.0–400.0)
RBC: 4.94 Mil/uL (ref 4.22–5.81)
RDW: 15.7 % — ABNORMAL HIGH (ref 11.5–15.5)
WBC: 5 10*3/uL (ref 4.0–10.5)

## 2022-05-13 LAB — LIPID PANEL
Cholesterol: 166 mg/dL (ref 0–200)
HDL: 37.3 mg/dL — ABNORMAL LOW (ref >39.00)
LDL Cholesterol: 103 mg/dL — ABNORMAL HIGH (ref 0–99)
NonHDL: 128.68
Total CHOL/HDL Ratio: 4
Triglycerides: 130 mg/dL (ref 0.0–149.0)
VLDL: 26 mg/dL (ref 0.0–40.0)

## 2022-05-13 LAB — COMPREHENSIVE METABOLIC PANEL
ALT: 27 U/L (ref 0–53)
AST: 36 U/L (ref 0–37)
Albumin: 4.3 g/dL (ref 3.5–5.2)
Alkaline Phosphatase: 69 U/L (ref 39–117)
BUN: 16 mg/dL (ref 6–23)
CO2: 28 mEq/L (ref 19–32)
Calcium: 10.2 mg/dL (ref 8.4–10.5)
Chloride: 99 mEq/L (ref 96–112)
Creatinine, Ser: 0.85 mg/dL (ref 0.40–1.50)
GFR: 88.62 mL/min (ref >60.00)
Glucose, Bld: 102 mg/dL — ABNORMAL HIGH (ref 70–99)
Potassium: 4.1 mEq/L (ref 3.5–5.1)
Sodium: 138 mEq/L (ref 135–145)
Total Bilirubin: 0.9 mg/dL (ref 0.2–1.2)
Total Protein: 7.5 g/dL (ref 6.0–8.3)

## 2022-05-13 LAB — HEMOGLOBIN A1C: Hgb A1c MFr Bld: 6.6 % — ABNORMAL HIGH (ref 4.6–6.5)

## 2022-05-18 ENCOUNTER — Other Ambulatory Visit: Payer: Self-pay | Admitting: Medical

## 2022-05-28 ENCOUNTER — Encounter: Payer: Self-pay | Admitting: Medical

## 2022-05-28 ENCOUNTER — Ambulatory Visit (INDEPENDENT_AMBULATORY_CARE_PROVIDER_SITE_OTHER): Payer: Medicare Other | Admitting: Medical

## 2022-05-28 VITALS — BP 120/70 | HR 90 | Temp 98.2°F | Resp 18 | Ht 72.0 in | Wt 309.4 lb

## 2022-05-28 DIAGNOSIS — E785 Hyperlipidemia, unspecified: Secondary | ICD-10-CM

## 2022-05-28 DIAGNOSIS — E119 Type 2 diabetes mellitus without complications: Secondary | ICD-10-CM

## 2022-05-28 DIAGNOSIS — I10 Essential (primary) hypertension: Secondary | ICD-10-CM | POA: Diagnosis not present
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
## 2022-05-28 NOTE — Patient Instructions (Addendum)
Diabetes a1c well controlled level 6.6 recently. Continue metformin daily and low sugar diet.  Htn- bp well controlled on recheck. On lisinopril hctz 20-25 daily and amlodipine.(Continue)  High cholesterol- recent good levels. Continue atorvastatin.  Obese with purposeful weight loss of 11 lbs. Continue healthy diet and exercise.  Follow up 3.5 month or sooner if needed.  Future cmp, lipid panel and a1c Get those labs one week prior to next visit.

## 2022-06-11 ENCOUNTER — Other Ambulatory Visit: Payer: Self-pay | Admitting: Medical

## 2022-08-12 ENCOUNTER — Ambulatory Visit: Payer: Medicare Other

## 2022-08-26 ENCOUNTER — Ambulatory Visit (INDEPENDENT_AMBULATORY_CARE_PROVIDER_SITE_OTHER): Payer: Medicare Other | Admitting: *Deleted

## 2022-08-26 DIAGNOSIS — Z0000 Encounter for general adult medical examination without abnormal findings: Secondary | ICD-10-CM

## 2022-08-26 NOTE — Patient Instructions (Signed)
Tyler Maynard , Thank you for taking time to come for your Medicare Wellness Visit. I appreciate your ongoing commitment to your health goals. Please review the following plan we discussed and let me know if I can assist you in the future.   These are the goals we discussed:  Goals      Patient Stated     Drink more water & increase activity        This is a list of the screening recommended for you and due dates:  Health Maintenance  Topic Date Due   Yearly kidney health urinalysis for diabetes  Never done   Hepatitis C Screening: USPSTF Recommendation to screen - Ages 74-79 yo.  Never done   Tetanus Vaccine  Never done   Zoster (Shingles) Vaccine (1 of 2) Never done   COVID-19 Vaccine (5 - Pfizer series) 05/01/2022   Flu Shot  06/29/2022   Yearly kidney function blood test for diabetes  05/13/2023   Colon Cancer Screening  04/22/2025   Pneumonia Vaccine  Completed   HPV Vaccine  Aged Out     Next appointment: Follow up in one year for your annual wellness visit.   Preventive Care 35 Years and Older, Male Preventive care refers to lifestyle choices and visits with your health care provider that can promote health and wellness. What does preventive care include? A yearly physical exam. This is also called an annual well check. Dental exams once or twice a year. Routine eye exams. Ask your health care provider how often you should have your eyes checked. Personal lifestyle choices, including: Daily care of your teeth and gums. Regular physical activity. Eating a healthy diet. Avoiding tobacco and drug use. Limiting alcohol use. Practicing safe sex. Taking low doses of aspirin every day. Taking vitamin and mineral supplements as recommended by your health care provider. What happens during an annual well check? The services and screenings done by your health care provider during your annual well check will depend on your age, overall health, lifestyle risk factors, and  family history of disease. Counseling  Your health care provider may ask you questions about your: Alcohol use. Tobacco use. Drug use. Emotional well-being. Home and relationship well-being. Sexual activity. Eating habits. History of falls. Memory and ability to understand (cognition). Work and work Statistician. Screening  You may have the following tests or measurements: Height, weight, and BMI. Blood pressure. Lipid and cholesterol levels. These may be checked every 5 years, or more frequently if you are over 36 years old. Skin check. Lung cancer screening. You may have this screening every year starting at age 69 if you have a 30-pack-year history of smoking and currently smoke or have quit within the past 15 years. Fecal occult blood test (FOBT) of the stool. You may have this test every year starting at age 17. Flexible sigmoidoscopy or colonoscopy. You may have a sigmoidoscopy every 5 years or a colonoscopy every 10 years starting at age 52. Prostate cancer screening. Recommendations will vary depending on your family history and other risks. Hepatitis C blood test. Hepatitis B blood test. Sexually transmitted disease (STD) testing. Diabetes screening. This is done by checking your blood sugar (glucose) after you have not eaten for a while (fasting). You may have this done every 1-3 years. Abdominal aortic aneurysm (AAA) screening. You may need this if you are a current or former smoker. Osteoporosis. You may be screened starting at age 63 if you are at high risk. Talk with  your health care provider about your test results, treatment options, and if necessary, the need for more tests. Vaccines  Your health care provider may recommend certain vaccines, such as: Influenza vaccine. This is recommended every year. Tetanus, diphtheria, and acellular pertussis (Tdap, Td) vaccine. You may need a Td booster every 10 years. Zoster vaccine. You may need this after age 58. Pneumococcal  13-valent conjugate (PCV13) vaccine. One dose is recommended after age 71. Pneumococcal polysaccharide (PPSV23) vaccine. One dose is recommended after age 46. Talk to your health care provider about which screenings and vaccines you need and how often you need them. This information is not intended to replace advice given to you by your health care provider. Make sure you discuss any questions you have with your health care provider. Document Released: 12/12/2015 Document Revised: 08/04/2016 Document Reviewed: 09/16/2015 Elsevier Interactive Patient Education  2017 [employer] Prevention in the Home Falls can cause injuries. They can happen to people of all ages. There are many things you can do to make your home safe and to help prevent falls. What can I do on the outside of my home? Regularly fix the edges of walkways and driveways and fix any cracks. Remove anything that might make you trip as you walk through a door, such as a raised step or threshold. Trim any bushes or trees on the path to your home. Use bright outdoor lighting. Clear any walking paths of anything that might make someone trip, such as rocks or tools. Regularly check to see if handrails are loose or broken. Make sure that both sides of any steps have handrails. Any raised decks and porches should have guardrails on the edges. Have any leaves, snow, or ice cleared regularly. Use sand or salt on walking paths during winter. Clean up any spills in your garage right away. This includes oil or grease spills. What can I do in the bathroom? Use night lights. Install grab bars by the toilet and in the tub and shower. Do not use towel bars as grab bars. Use non-skid mats or decals in the tub or shower. If you need to sit down in the shower, use a plastic, non-slip stool. Keep the floor dry. Clean up any water that spills on the floor as soon as it happens. Remove soap buildup in the tub or shower regularly. Attach  bath mats securely with double-sided non-slip rug tape. Do not have throw rugs and other things on the floor that can make you trip. What can I do in the bedroom? Use night lights. Make sure that you have a light by your bed that is easy to reach. Do not use any sheets or blankets that are too big for your bed. They should not hang down onto the floor. Have a firm chair that has side arms. You can use this for support while you get dressed. Do not have throw rugs and other things on the floor that can make you trip. What can I do in the kitchen? Clean up any spills right away. Avoid walking on wet floors. Keep items that you use a lot in easy-to-reach places. If you need to reach something above you, use a strong step stool that has a grab bar. Keep electrical cords out of the way. Do not use floor polish or wax that makes floors slippery. If you must use wax, use non-skid floor wax. Do not have throw rugs and other things on the floor that can make you trip.  What can I do with my stairs? Do not leave any items on the stairs. Make sure that there are handrails on both sides of the stairs and use them. Fix handrails that are broken or loose. Make sure that handrails are as long as the stairways. Check any carpeting to make sure that it is firmly attached to the stairs. Fix any carpet that is loose or worn. Avoid having throw rugs at the top or bottom of the stairs. If you do have throw rugs, attach them to the floor with carpet tape. Make sure that you have a light switch at the top of the stairs and the bottom of the stairs. If you do not have them, ask someone to add them for you. What else can I do to help prevent falls? Wear shoes that: Do not have high heels. Have rubber bottoms. Are comfortable and fit you well. Are closed at the toe. Do not wear sandals. If you use a stepladder: Make sure that it is fully opened. Do not climb a closed stepladder. Make sure that both sides of the  stepladder are locked into place. Ask someone to hold it for you, if possible. Clearly mark and make sure that you can see: Any grab bars or handrails. First and last steps. Where the edge of each step is. Use tools that help you move around (mobility aids) if they are needed. These include: Canes. Walkers. Scooters. Crutches. Turn on the lights when you go into a dark area. Replace any light bulbs as soon as they burn out. Set up your furniture so you have a clear path. Avoid moving your furniture around. If any of your floors are uneven, fix them. If there are any pets around you, be aware of where they are. Review your medicines with your doctor. Some medicines can make you feel dizzy. This can increase your chance of falling. Ask your doctor what other things that you can do to help prevent falls. This information is not intended to replace advice given to you by your health care provider. Make sure you discuss any questions you have with your health care provider. Document Released: 09/11/2009 Document Revised: 04/22/2016 Document Reviewed: 12/20/2014 Elsevier Interactive Patient Education  2017 [employer].
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
## 2022-09-03 ENCOUNTER — Other Ambulatory Visit (INDEPENDENT_AMBULATORY_CARE_PROVIDER_SITE_OTHER): Payer: Medicare Other

## 2022-09-03 DIAGNOSIS — E785 Hyperlipidemia, unspecified: Secondary | ICD-10-CM

## 2022-09-03 DIAGNOSIS — E119 Type 2 diabetes mellitus without complications: Secondary | ICD-10-CM

## 2022-09-03 LAB — LIPID PANEL
Cholesterol: 141 mg/dL (ref 0–200)
HDL: 32.4 mg/dL — ABNORMAL LOW (ref >39.00)
LDL Cholesterol: 85 mg/dL (ref 0–99)
NonHDL: 108.97
Total CHOL/HDL Ratio: 4
Triglycerides: 121 mg/dL (ref 0.0–149.0)
VLDL: 24.2 mg/dL (ref 0.0–40.0)

## 2022-09-03 LAB — COMPREHENSIVE METABOLIC PANEL
ALT: 28 U/L (ref 0–53)
AST: 48 U/L — ABNORMAL HIGH (ref 0–37)
Albumin: 4.3 g/dL (ref 3.5–5.2)
Alkaline Phosphatase: 66 U/L (ref 39–117)
BUN: 18 mg/dL (ref 6–23)
CO2: 30 mEq/L (ref 19–32)
Calcium: 10.2 mg/dL (ref 8.4–10.5)
Chloride: 95 mEq/L — ABNORMAL LOW (ref 96–112)
Creatinine, Ser: 1.02 mg/dL (ref 0.40–1.50)
GFR: 74.79 mL/min (ref >60.00)
Glucose, Bld: 101 mg/dL — ABNORMAL HIGH (ref 70–99)
Potassium: 4 mEq/L (ref 3.5–5.1)
Sodium: 135 mEq/L (ref 135–145)
Total Bilirubin: 1 mg/dL (ref 0.2–1.2)
Total Protein: 7.4 g/dL (ref 6.0–8.3)

## 2022-09-03 LAB — HEMOGLOBIN A1C: Hgb A1c MFr Bld: 6.4 % (ref 4.6–6.5)

## 2022-09-07 ENCOUNTER — Other Ambulatory Visit: Payer: Self-pay | Admitting: Medical

## 2022-09-10 ENCOUNTER — Ambulatory Visit: Payer: Medicare Other | Admitting: Medical

## 2022-09-13 ENCOUNTER — Ambulatory Visit: Payer: Medicare Other | Admitting: Medical

## 2022-09-16 ENCOUNTER — Ambulatory Visit (INDEPENDENT_AMBULATORY_CARE_PROVIDER_SITE_OTHER): Payer: Medicare Other | Admitting: Medical

## 2022-09-16 VITALS — BP 119/70 | HR 69 | Temp 98.2°F | Resp 18 | Ht 72.0 in | Wt 293.0 lb

## 2022-09-16 DIAGNOSIS — Z23 Encounter for immunization: Secondary | ICD-10-CM

## 2022-09-16 DIAGNOSIS — E119 Type 2 diabetes mellitus without complications: Secondary | ICD-10-CM | POA: Diagnosis not present

## 2022-09-16 DIAGNOSIS — I10 Essential (primary) hypertension: Secondary | ICD-10-CM

## 2022-09-16 DIAGNOSIS — E785 Hyperlipidemia, unspecified: Secondary | ICD-10-CM | POA: Diagnosis not present

## 2022-09-16 NOTE — Patient Instructions (Addendum)
Diabetes a1c well controlled level 6.4 recently. Continue metformin daily and low sugar diet.   Htn- bp well controlled on recheck. On lisinopril hctz 20-25 daily and amlodipine.(Continue)   High cholesterol- recent good levels. Continue atorvastatin.   Obese with continued purposeful wt loss. . Continue healthy diet and exercise.  Follow up in 6 month again or sooner if needed.  Can do labs one week prior to next appt.
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
## 2022-09-16 NOTE — Addendum Note (Signed)
Addended by: Beatris Ship L on: 09/16/2022 04:22 PM   Modules accepted: Orders

## 2022-09-17 ENCOUNTER — Other Ambulatory Visit (HOSPITAL_BASED_OUTPATIENT_CLINIC_OR_DEPARTMENT_OTHER): Payer: Self-pay

## 2022-09-17 MED ORDER — COMIRNATY 30 MCG/0.3ML IM SUSY
PREFILLED_SYRINGE | INTRAMUSCULAR | 0 refills | Status: DC
  Filled 2022-09-17: qty 0.3, 1d supply, fill #0

## 2022-10-07 ENCOUNTER — Telehealth: Payer: Self-pay | Admitting: Medical

## 2022-10-07 MED ORDER — AMLODIPINE BESYLATE 2.5 MG PO TABS: ORAL_TABLET | ORAL | 1 refills | Status: DC

## 2022-10-07 MED ORDER — METFORMIN HCL ER 500 MG PO TB24: ORAL_TABLET | ORAL | 1 refills | Status: DC

## 2022-10-07 NOTE — Telephone Encounter (Signed)
Rx sent 

## 2022-10-07 NOTE — Telephone Encounter (Signed)
Patient leaving Saturday for 8 day cruise and only has very few pills left. Patient said the pharmacy told him the medications were discontinued but he doesn't remember being told that. Please call patient to advise    Medication: metFORMIN (GLUCOPHAGE-XR) 500 MG 24 hr tablet   amLODipine (NORVASC) 2.5 MG tablet     Preferred Pharmacy (with phone number or street name): [facility] [address], [address] [place], [place] [address] Phone: [phone]  Fax: [phone]   Agent: Please be advised that RX refills may take up to 3 business days. We ask that you follow-up with your pharmacy.

## 2022-10-18 ENCOUNTER — Encounter: Payer: Self-pay | Admitting: Medical

## 2022-11-15 ENCOUNTER — Other Ambulatory Visit: Payer: Self-pay | Admitting: Medical

## 2023-01-11 ENCOUNTER — Telehealth: Payer: Self-pay | Admitting: Family

## 2023-01-11 ENCOUNTER — Ambulatory Visit (INDEPENDENT_AMBULATORY_CARE_PROVIDER_SITE_OTHER): Payer: Medicare Other | Admitting: Family

## 2023-01-11 VITALS — BP 160/100 | HR 79 | Temp 98.1°F | Resp 16 | Wt 308.0 lb

## 2023-01-11 DIAGNOSIS — R3129 Other microscopic hematuria: Secondary | ICD-10-CM

## 2023-01-11 DIAGNOSIS — R350 Frequency of micturition: Secondary | ICD-10-CM | POA: Diagnosis not present

## 2023-01-11 DIAGNOSIS — E119 Type 2 diabetes mellitus without complications: Secondary | ICD-10-CM

## 2023-01-11 DIAGNOSIS — I10 Essential (primary) hypertension: Secondary | ICD-10-CM

## 2023-01-11 LAB — POC URINALSYSI DIPSTICK (AUTOMATED)
Glucose, UA: NEGATIVE
Ketones, UA: NEGATIVE
Leukocytes, UA: NEGATIVE
Nitrite, UA: NEGATIVE
Protein, UA: POSITIVE — AB
Spec Grav, UA: 1.01 (ref 1.010–1.025)
Urobilinogen, UA: 4 E.U./dL — AB
pH, UA: 7.5 (ref 5.0–8.0)

## 2023-01-11 MED ORDER — ATORVASTATIN CALCIUM 40 MG PO TABS: 40.0000 mg | ORAL_TABLET | Freq: Every day | ORAL | 1 refills | Status: DC

## 2023-01-11 MED ORDER — LISINOPRIL-HYDROCHLOROTHIAZIDE 20-25 MG PO TABS: 1.0000 | ORAL_TABLET | Freq: Every day | ORAL | 0 refills | Status: DC

## 2023-01-11 MED ORDER — FENOFIBRATE 54 MG PO TABS: ORAL_TABLET | ORAL | 1 refills | Status: DC

## 2023-01-11 MED ORDER — CYCLOBENZAPRINE HCL 5 MG PO TABS: 5.0000 mg | ORAL_TABLET | Freq: Every day | ORAL | 0 refills | Status: DC

## 2023-01-11 MED ORDER — METFORMIN HCL ER 500 MG PO TB24: ORAL_TABLET | ORAL | 1 refills | Status: DC

## 2023-01-11 NOTE — Assessment & Plan Note (Addendum)
BP is up due to being off of medications. Restart medication and plan follow up with PCP for BP recheck.
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
## 2023-01-12 DIAGNOSIS — R350 Frequency of micturition: Secondary | ICD-10-CM | POA: Insufficient documentation

## 2023-01-12 DIAGNOSIS — E119 Type 2 diabetes mellitus without complications: Secondary | ICD-10-CM | POA: Insufficient documentation

## 2023-01-12 LAB — COMPREHENSIVE METABOLIC PANEL
ALT: 30 U/L (ref 0–53)
AST: 47 U/L — ABNORMAL HIGH (ref 0–37)
Albumin: 4 g/dL (ref 3.5–5.2)
Alkaline Phosphatase: 83 U/L (ref 39–117)
BUN: 12 mg/dL (ref 6–23)
CO2: 29 mEq/L (ref 19–32)
Calcium: 9.7 mg/dL (ref 8.4–10.5)
Chloride: 100 mEq/L (ref 96–112)
Creatinine, Ser: 0.8 mg/dL (ref 0.40–1.50)
GFR: 89.83 mL/min (ref >60.00)
Glucose, Bld: 85 mg/dL (ref 70–99)
Potassium: 4 mEq/L (ref 3.5–5.1)
Sodium: 138 mEq/L (ref 135–145)
Total Bilirubin: 1.3 mg/dL — ABNORMAL HIGH (ref 0.2–1.2)
Total Protein: 7.3 g/dL (ref 6.0–8.3)

## 2023-01-12 LAB — URINALYSIS, ROUTINE W REFLEX MICROSCOPIC
Bilirubin Urine: NEGATIVE
Ketones, ur: NEGATIVE
Leukocytes,Ua: NEGATIVE
Nitrite: NEGATIVE
Specific Gravity, Urine: 1.02 (ref 1.000–1.030)
Total Protein, Urine: 30 — AB
Urine Glucose: NEGATIVE
Urobilinogen, UA: >=8 — AB (ref 0.0–1.0)
pH: 7.5 (ref 5.0–8.0)

## 2023-01-12 LAB — HEMOGLOBIN A1C: Hgb A1c MFr Bld: 6.3 % (ref 4.6–6.5)

## 2023-01-12 LAB — URINE CULTURE
MICRO NUMBER:: 14558439
Result:: NO GROWTH
SPECIMEN QUALITY:: ADEQUATE

## 2023-01-12 LAB — PSA: PSA: 2.5 ng/mL (ref 0.10–4.00)

## 2023-01-12 NOTE — Assessment & Plan Note (Signed)
Rule out UTI.  If culture is negative, consider trial of flomax.  A1C is not high enough to explain increased urinary frequency.

## 2023-01-12 NOTE — Assessment & Plan Note (Signed)
Lab Results  Component Value Date   HGBA1C 6.3 01/11/2023   Pt is maintained on metformin. A1C at goal.

## 2023-01-13 NOTE — Telephone Encounter (Signed)
See mychart.  

## 2023-01-19 ENCOUNTER — Emergency Department (HOSPITAL_BASED_OUTPATIENT_CLINIC_OR_DEPARTMENT_OTHER)
Admission: EM | Admit: 2023-01-19 | Discharge: 2023-01-19 | Disposition: A | Discharge status: Home / Self Care | Payer: Medicare Other | Attending: Emergency Medicine | Admitting: Emergency Medicine

## 2023-01-19 ENCOUNTER — Other Ambulatory Visit: Payer: Self-pay

## 2023-01-19 ENCOUNTER — Encounter (HOSPITAL_BASED_OUTPATIENT_CLINIC_OR_DEPARTMENT_OTHER): Payer: Self-pay | Admitting: Pediatrics

## 2023-01-19 ENCOUNTER — Emergency Department (HOSPITAL_BASED_OUTPATIENT_CLINIC_OR_DEPARTMENT_OTHER): Payer: Medicare Other

## 2023-01-19 DIAGNOSIS — M79609 Pain in unspecified limb: Secondary | ICD-10-CM

## 2023-01-19 DIAGNOSIS — M79671 Pain in right foot: Secondary | ICD-10-CM | POA: Insufficient documentation

## 2023-01-19 DIAGNOSIS — M79604 Pain in right leg: Secondary | ICD-10-CM | POA: Diagnosis not present

## 2023-01-19 DIAGNOSIS — M19071 Primary osteoarthritis, right ankle and foot: Secondary | ICD-10-CM | POA: Diagnosis not present

## 2023-01-19 NOTE — ED Triage Notes (Signed)
C/o right foot pain; with some relief from ibuprofen; pain improves with activity.

## 2023-01-19 NOTE — ED Provider Notes (Signed)
[facility] Provider Note   CSN: [id number] Arrival date & time: 01/19/23  1442     History Chief Complaint  Patient presents with   Foot Pain    HPI Tyler Maynard is a 72 y.o. male presenting for right foot pain.  States his heel has been hurting him for 2 months.  He denies fevers or chills, nausea vomiting and some shortness of breath.  He is otherwise ambulatory tolerating p.o. intake..   Patient's recorded medical, surgical, social, medication list and allergies were reviewed in the Snapshot window as part of the initial history.   Review of Systems   Review of Systems  Constitutional:  Negative for chills and fever.  HENT:  Negative for ear pain and sore throat.   Eyes:  Negative for pain and visual disturbance.  Respiratory:  Negative for cough and shortness of breath.   Cardiovascular:  Negative for chest pain and palpitations.  Gastrointestinal:  Negative for abdominal pain and vomiting.  Genitourinary:  Negative for dysuria and hematuria.  Musculoskeletal:  Negative for arthralgias and back pain.  Skin:  Negative for color change and rash.  Neurological:  Negative for seizures and syncope.  All other systems reviewed and are negative.   Physical Exam Updated Vital Signs BP (!) 155/91 (BP Location: Left Arm)   Pulse 83   Temp 98 F (36.7 C)   Resp 18   Ht 6' (1.829 m)   Wt (!) 139.7 kg   SpO2 100%   BMI 41.77 kg/m  Physical Exam Vitals and nursing note reviewed.  Constitutional:      General: He is not in acute distress.    Appearance: He is well-developed.  HENT:     Head: Normocephalic and atraumatic.  Eyes:     Conjunctiva/sclera: Conjunctivae normal.  Cardiovascular:     Rate and Rhythm: Normal rate and regular rhythm.     Heart sounds: No murmur heard. Pulmonary:     Effort: Pulmonary effort is normal. No respiratory distress.     Breath sounds: Normal breath sounds.  Abdominal:      Palpations: Abdomen is soft.     Tenderness: There is no abdominal tenderness.  Musculoskeletal:        General: Tenderness (Tenderness to palpation over the right plantar area and heel) present. No swelling.     Cervical back: Neck supple.  Skin:    General: Skin is warm and dry.     Capillary Refill: Capillary refill takes less than 2 seconds.  Neurological:     Mental Status: He is alert.  Psychiatric:        Mood and Affect: Mood normal.      ED Course/ Medical Decision Making/ A&P    Procedures Procedures   Medications Ordered in ED Medications - No data to display  Medical Decision Making:    Tyler Maynard is a 72 y.o. male who presented to the ED today with right foot pain detailed above.     X-ray performed and reviewed without evidence of fracture.  He likely has developing plantar fasciitis versus nonspecific injury.  He denies recent antibiotics or pain over his Achilles tendon. Discussed supportive care with Tylenol 3 times daily, follow-up with podiatry given ongoing foot pain and importance of continued outpatient care and management with primary care provider in the interim.  No acute indication for further intervention emergency department no evidence of diabetic foot ulcer or other severe pathology.c Disposition:  I have considered need for hospitalization, however, considering all of the above, I believe this patient is stable for discharge at this time.  Patient/family educated about specific return precautions for given chief complaint and symptoms.  Patient/family educated about follow-up with PCP/Podiatry.     Patient/family expressed understanding of return precautions and need for follow-up. Patient spoken to regarding all imaging and laboratory results and appropriate follow up for these results. All education provided in verbal form with additional information in written form. Time was allowed for answering of patient questions. Patient discharged.     Emergency Department Medication Summary:   Medications - No data to display     . Clinical Impression:  1. Pain in extremity, unspecified extremity      Discharge   Final Clinical Impression(s) / ED Diagnoses Final diagnoses:  Pain in extremity, unspecified extremity    Rx / DC Orders ED Discharge Orders     None         Tretha Sciara, MD 01/19/23 1732

## 2023-01-19 NOTE — Discharge Instructions (Signed)
You were seen today for foot pain. We did not identify any emergent cause for your symptoms. Your evaluation is most consistent with nonspecific cause.   Plan and next steps:   The following may be helpful in managing your symptoms:   Pain- Lidocaine Patches  Apply to affected area for up to 12 hours at a time.   Pain/Fever- Adult Tylenol dosing:  650 mg orally every 4 to 6 hours as needed, MAX: 3250 mg/24 hours   (Extra-strength) 1000 mg orally every 6 hours as needed; MAX: 3000 mg/24 hours   Do not use if you have liver disease. Read the label on the bottle.   Pain/Fever- Adult Ibuprofen Dosing  200 to 400 mg orally every 4 to 6 hours as needed; MAX 1200 mg/day; do not take longer than 10 days   Do not use if you have kidney disease. Read the label on the bottle   Findings:  You may see all of your lab and imaging results utilizing our online portal! Look in this document or ask a team member for your mychart* access information. The most notable results have additionally been verbally communicated with you and your bedside family.    Follow-up Plan:   Follow up with the patient's normal primary care provider for monitoring of this condition within 48 hours.   Signs/Symptoms that would warrant return to the ED:  Please return to the ED if you experience worsening of symptoms or any abrupt changes in your health. Standard of care precautions for your chief complaint have already been verbally communicated with you. Always be on alert for fevers, chills, shortness of breath, chest pains, or sudden changes that warrant immediate evaluation.    Thank you for allowing [place] to be a part of you and your families' care.   Tretha Sciara MD

## 2023-01-19 NOTE — ED Notes (Signed)
Pt verbally understood discharge instructions.  Topaz signature not working

## 2023-01-25 ENCOUNTER — Ambulatory Visit: Payer: Medicare Other | Admitting: Medical

## 2023-01-26 ENCOUNTER — Ambulatory Visit: Payer: Medicare Other | Admitting: Medical

## 2023-01-27 ENCOUNTER — Encounter: Payer: Self-pay | Admitting: Family Medicine

## 2023-01-27 ENCOUNTER — Ambulatory Visit (INDEPENDENT_AMBULATORY_CARE_PROVIDER_SITE_OTHER): Payer: Medicare Other | Admitting: Family Medicine

## 2023-01-27 VITALS — BP 130/81 | HR 80 | Temp 98.4°F | Resp 18 | Ht 72.0 in | Wt 301.2 lb

## 2023-01-27 DIAGNOSIS — I10 Essential (primary) hypertension: Secondary | ICD-10-CM

## 2023-01-27 DIAGNOSIS — M79671 Pain in right foot: Secondary | ICD-10-CM

## 2023-01-27 DIAGNOSIS — E119 Type 2 diabetes mellitus without complications: Secondary | ICD-10-CM | POA: Diagnosis not present

## 2023-01-27 DIAGNOSIS — R350 Frequency of micturition: Secondary | ICD-10-CM

## 2023-01-27 NOTE — Progress Notes (Signed)
Acute Office Visit  Subjective:     Patient ID: Tyler Maynard, male    DOB: 14-Nov-1951, 72 y.o.   MRN: [id number]  Chief Complaint  Patient presents with   2 week ER follow up    Concerns/ questions: this is also a follow up from the [facility] with [facility].       HPI Patient is in today for follow-up.  Patient saw Debbrah Alar in our office on 01/11/23 for increased urinary frequency and nocturia. Workup was unremarkable and patient was restarted on his chronic meds (states he has been out for about 6 weeks). Since resuming medications, all symptoms have resolved and he is back to baseline now. No new complaints.   On 01/19/23 he went to the ED for right heel pain which he believes was related to climbing the ladder/steps of his truck. He has since stopped doing this motion and pain has completely resolved. States that since he has had this occasional pain recur at times, he would like to go ahead and see podiatry. No new concerns today.     ROS All review of systems negative except what is listed in the HPI      Objective:    BP 130/81   Pulse 80   Temp 98.4 F (36.9 C) (Temporal)   Resp 18   Ht 6' (1.829 m)   Wt (!) 301 lb 3.2 oz (136.6 kg)   SpO2 98%   BMI 40.85 kg/m    Physical Exam Vitals reviewed.  Constitutional:      Appearance: Normal appearance.  Cardiovascular:     Rate and Rhythm: Normal rate and regular rhythm.     Pulses: Normal pulses.     Heart sounds: Normal heart sounds.  Pulmonary:     Effort: Pulmonary effort is normal.     Breath sounds: Normal breath sounds.  Musculoskeletal:        General: No swelling or tenderness. Normal range of motion.     Right lower leg: No edema.     Left lower leg: No edema.  Skin:    General: Skin is warm and dry.  Neurological:     Mental Status: He is alert and oriented to person, place, and time.  Psychiatric:        Mood and Affect: Mood normal.        Behavior: Behavior normal.        Thought Content:  Thought content normal.        Judgment: Judgment normal.     No results found for any visits on 01/27/23.      Assessment & Plan:   Problem List Items Addressed This Visit       Cardiovascular and Mediastinum   Essential (primary) hypertension    Blood pressure is at goal for age and co-morbidities.   Recommendations: continue current meds - BP goal <130/80 - monitor and log blood pressures at home - check around the same time each day in a relaxed setting - Limit salt to <2000 mg/day - Follow DASH eating plan (heart healthy diet) - limit alcohol to 2 standard drinks per day for men and 1 per day for women - avoid tobacco products - get at least 2 hours of regular aerobic exercise weekly Patient aware of signs/symptoms requiring further/urgent evaluation.          Endocrine   Controlled type 2 diabetes mellitus without complication, without long-term current use of insulin (HCC)    Stable  A1c. Continue metformin as prescribed Discussed diet and exercise         Other   Urinary frequency    Fully resolved since resuming medication No new symptoms or concerns       Other Visit Diagnoses     Right foot pain    -  Primary Resolved currently, but given occasional recurrence and unknown triggers, he would like to establish with podiatry.    Relevant Orders   Ambulatory referral to Podiatry       No orders of the defined types were placed in this encounter.   Return for - keep upcoming PCP appointment .  Terrilyn Saver, NP

## 2023-01-27 NOTE — Assessment & Plan Note (Signed)
Stable A1c. Continue metformin as prescribed Discussed diet and exercise

## 2023-01-27 NOTE — Assessment & Plan Note (Signed)
Blood pressure is at goal for age and co-morbidities.   Recommendations: continue current meds - BP goal <130/80 - monitor and log blood pressures at home - check around the same time each day in a relaxed setting - Limit salt to <2000 mg/day - Follow DASH eating plan (heart healthy diet) - limit alcohol to 2 standard drinks per day for men and 1 per day for women - avoid tobacco products - get at least 2 hours of regular aerobic exercise weekly Patient aware of signs/symptoms requiring further/urgent evaluation.

## 2023-01-27 NOTE — Assessment & Plan Note (Signed)
Fully resolved since resuming medication No new symptoms or concerns

## 2023-02-03 ENCOUNTER — Ambulatory Visit: Payer: Medicare Other | Admitting: Podiatry

## 2023-02-16 ENCOUNTER — Ambulatory Visit: Payer: Medicare Other | Admitting: Podiatry

## 2023-03-03 DIAGNOSIS — M542 Cervicalgia: Secondary | ICD-10-CM | POA: Diagnosis not present

## 2023-03-03 DIAGNOSIS — M25512 Pain in left shoulder: Secondary | ICD-10-CM | POA: Diagnosis not present

## 2023-03-03 DIAGNOSIS — E109 Type 1 diabetes mellitus without complications: Secondary | ICD-10-CM | POA: Diagnosis not present

## 2023-03-03 DIAGNOSIS — Z0000 Encounter for general adult medical examination without abnormal findings: Secondary | ICD-10-CM | POA: Diagnosis not present

## 2023-03-03 DIAGNOSIS — Z9181 History of falling: Secondary | ICD-10-CM | POA: Diagnosis not present

## 2023-03-11 ENCOUNTER — Other Ambulatory Visit: Payer: Medicare Other

## 2023-03-11 DIAGNOSIS — E785 Hyperlipidemia, unspecified: Secondary | ICD-10-CM

## 2023-03-11 DIAGNOSIS — E119 Type 2 diabetes mellitus without complications: Secondary | ICD-10-CM

## 2023-03-11 NOTE — Addendum Note (Signed)
Addended by: Rosita Kea on: 03/11/2023 02:26 PM   Modules accepted: Orders

## 2023-03-12 LAB — LIPID PANEL
Cholesterol: 155 mg/dL (ref <200)
HDL: 38 mg/dL — ABNORMAL LOW (ref >=40)
LDL Cholesterol (Calc): 96 mg/dL (calc)
Non-HDL Cholesterol (Calc): 117 mg/dL (calc) (ref <130)
Total CHOL/HDL Ratio: 4.1 (calc) (ref <5.0)
Triglycerides: 110 mg/dL (ref <150)

## 2023-03-14 ENCOUNTER — Encounter: Payer: Self-pay | Admitting: *Deleted

## 2023-03-18 ENCOUNTER — Ambulatory Visit (INDEPENDENT_AMBULATORY_CARE_PROVIDER_SITE_OTHER): Payer: Medicare Other | Admitting: Medical

## 2023-03-18 ENCOUNTER — Other Ambulatory Visit (HOSPITAL_BASED_OUTPATIENT_CLINIC_OR_DEPARTMENT_OTHER): Payer: Medicare Other

## 2023-03-18 VITALS — BP 132/76 | HR 77 | Temp 98.4°F | Resp 18 | Ht 72.0 in | Wt 296.0 lb

## 2023-03-18 DIAGNOSIS — E785 Hyperlipidemia, unspecified: Secondary | ICD-10-CM

## 2023-03-18 DIAGNOSIS — E119 Type 2 diabetes mellitus without complications: Secondary | ICD-10-CM

## 2023-03-18 DIAGNOSIS — I10 Essential (primary) hypertension: Secondary | ICD-10-CM

## 2023-03-18 DIAGNOSIS — Z7984 Long term (current) use of oral hypoglycemic drugs: Secondary | ICD-10-CM

## 2023-03-18 DIAGNOSIS — M542 Cervicalgia: Secondary | ICD-10-CM

## 2023-03-18 MED ORDER — LEVOCETIRIZINE DIHYDROCHLORIDE 5 MG PO TABS: 5.0000 mg | ORAL_TABLET | Freq: Every evening | ORAL | 3 refills | Status: DC

## 2023-03-18 MED ORDER — AZELASTINE HCL 0.1 % NA SOLN: 2.0000 | Freq: Two times a day (BID) | NASAL | 12 refills | Status: DC

## 2023-03-18 MED ORDER — AZITHROMYCIN 250 MG PO TABS: ORAL_TABLET | ORAL | 0 refills | Status: AC

## 2023-03-18 NOTE — Patient Instructions (Addendum)
1. Neck pain Will see if you have osteoarthritis. - DG Cervical Spine Complete; Future  2. Hyperlipidemia, unspecified hyperlipidemia type Continue fenofibrate and atorvastatin.  3. Controlled type 2 diabetes mellitus without complication, without long-term current use of insulin Last A1c was 6.3. continue metformin.   4. Hypertension, unspecified type Bp controlled today. Continue amlodipine and zestoretic.   5. Allergic rhintis with sinus infection. Rx astelin nasal spray and azithromycin antibiotic. In 7-10 days after antibiotic could add on xyzal   Follow up date to be 3 month or sooner if needed.
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
## 2023-03-18 NOTE — Addendum Note (Signed)
Addended by: Rosita Kea on: 03/18/2023 03:41 PM   Modules accepted: Orders

## 2023-03-19 LAB — MICROALBUMIN / CREATININE URINE RATIO
Creatinine, Urine: 164 mg/dL (ref 20–320)
Microalb Creat Ratio: 15 mg/g creat (ref <30)
Microalb, Ur: 2.4 mg/dL

## 2023-03-21 ENCOUNTER — Ambulatory Visit (HOSPITAL_BASED_OUTPATIENT_CLINIC_OR_DEPARTMENT_OTHER)
Admission: RE | Admit: 2023-03-21 | Discharge: 2023-03-21 | Disposition: A | Discharge status: Home / Self Care | Payer: Medicare Other | Source: Ambulatory Visit | Attending: Medical | Admitting: Medical

## 2023-03-21 DIAGNOSIS — M542 Cervicalgia: Secondary | ICD-10-CM | POA: Diagnosis not present

## 2023-03-21 DIAGNOSIS — M47812 Spondylosis without myelopathy or radiculopathy, cervical region: Secondary | ICD-10-CM | POA: Diagnosis not present

## 2023-04-06 ENCOUNTER — Telehealth: Payer: Self-pay | Admitting: Medical

## 2023-04-06 ENCOUNTER — Other Ambulatory Visit: Payer: Self-pay | Admitting: Family

## 2023-04-06 MED ORDER — METFORMIN HCL ER 500 MG PO TB24: ORAL_TABLET | ORAL | 1 refills | Status: DC

## 2023-04-06 MED ORDER — FENOFIBRATE 54 MG PO TABS: ORAL_TABLET | ORAL | 1 refills | Status: DC

## 2023-04-06 MED ORDER — LISINOPRIL-HYDROCHLOROTHIAZIDE 20-25 MG PO TABS: 1.0000 | ORAL_TABLET | Freq: Every day | ORAL | 1 refills | Status: DC

## 2023-04-06 NOTE — Telephone Encounter (Signed)
Rx sent 

## 2023-04-06 NOTE — Telephone Encounter (Signed)
Patient called and stated called pharmacy for med refill for Fenofibrate 54mg , Lisinopril 20-25mg  and MetFormin 500mg  and none was on file. Please send to [facility] on [address] in [place].   Thank you

## 2023-06-09 ENCOUNTER — Telehealth: Payer: Self-pay | Admitting: Medical

## 2023-06-09 DIAGNOSIS — E119 Type 2 diabetes mellitus without complications: Secondary | ICD-10-CM

## 2023-06-09 DIAGNOSIS — I10 Essential (primary) hypertension: Secondary | ICD-10-CM

## 2023-06-09 DIAGNOSIS — E785 Hyperlipidemia, unspecified: Secondary | ICD-10-CM

## 2023-06-09 NOTE — Telephone Encounter (Signed)
Pt would like to complete his labs prior to his appt in October. Please advise once orders are in.

## 2023-06-10 NOTE — Telephone Encounter (Signed)
Appointment scheduled.

## 2023-07-06 DIAGNOSIS — H6123 Impacted cerumen, bilateral: Secondary | ICD-10-CM | POA: Diagnosis not present

## 2023-07-08 ENCOUNTER — Other Ambulatory Visit: Payer: Self-pay | Admitting: Medical

## 2023-07-18 ENCOUNTER — Other Ambulatory Visit: Payer: Self-pay | Admitting: Family

## 2023-08-18 DIAGNOSIS — E119 Type 2 diabetes mellitus without complications: Secondary | ICD-10-CM | POA: Diagnosis not present

## 2023-08-20 IMAGING — DX DG FOOT COMPLETE 3+V*L*
3 series · 3 of 3 positions shown · non-contrast
Comparison: None.

CLINICAL DATA: Bilateral foot pain for 4 days. Feels like standing
on a ball on right heel.

EXAM:
RIGHT FOOT COMPLETE - 3+ VIEW; LEFT FOOT - COMPLETE 3+ VIEW

[foot ap]
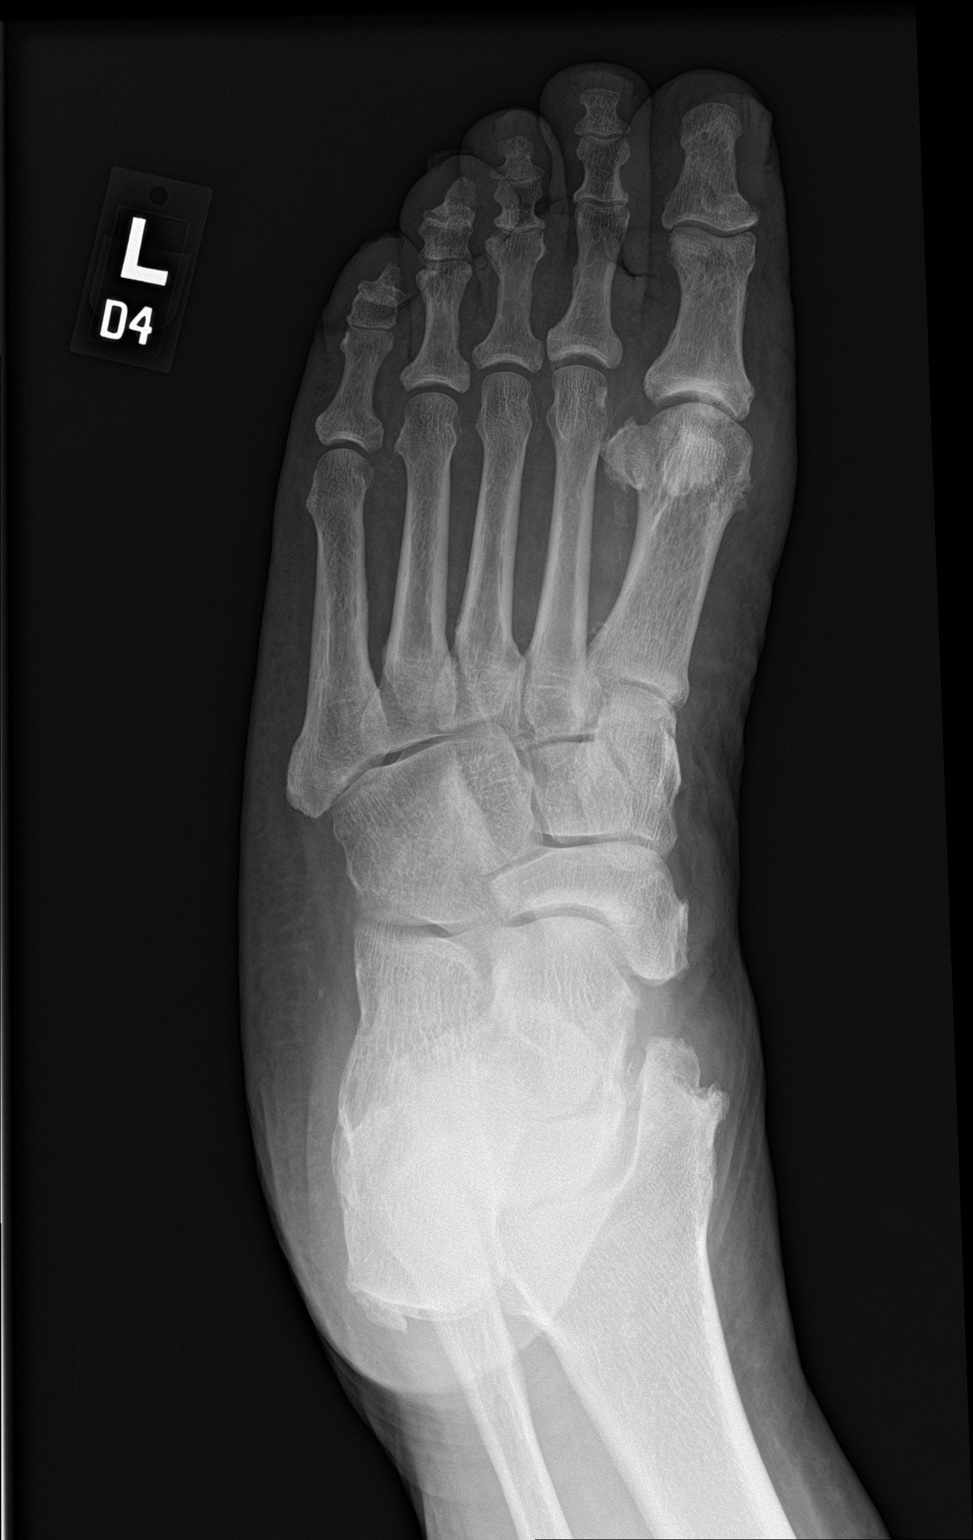

[foot obl]
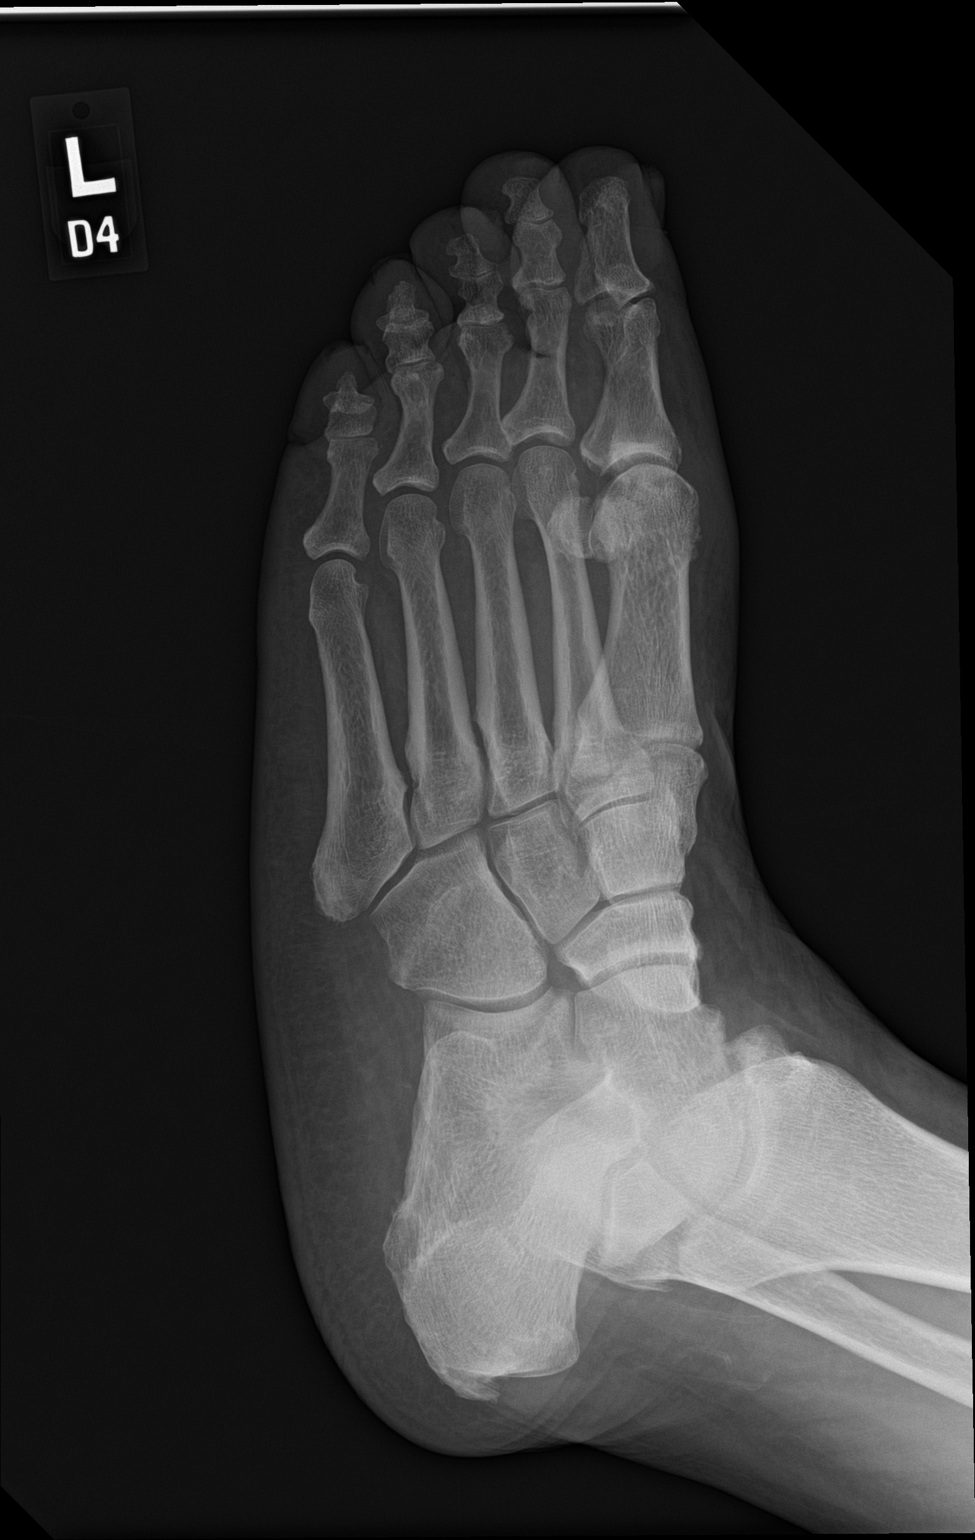

[foot lat]
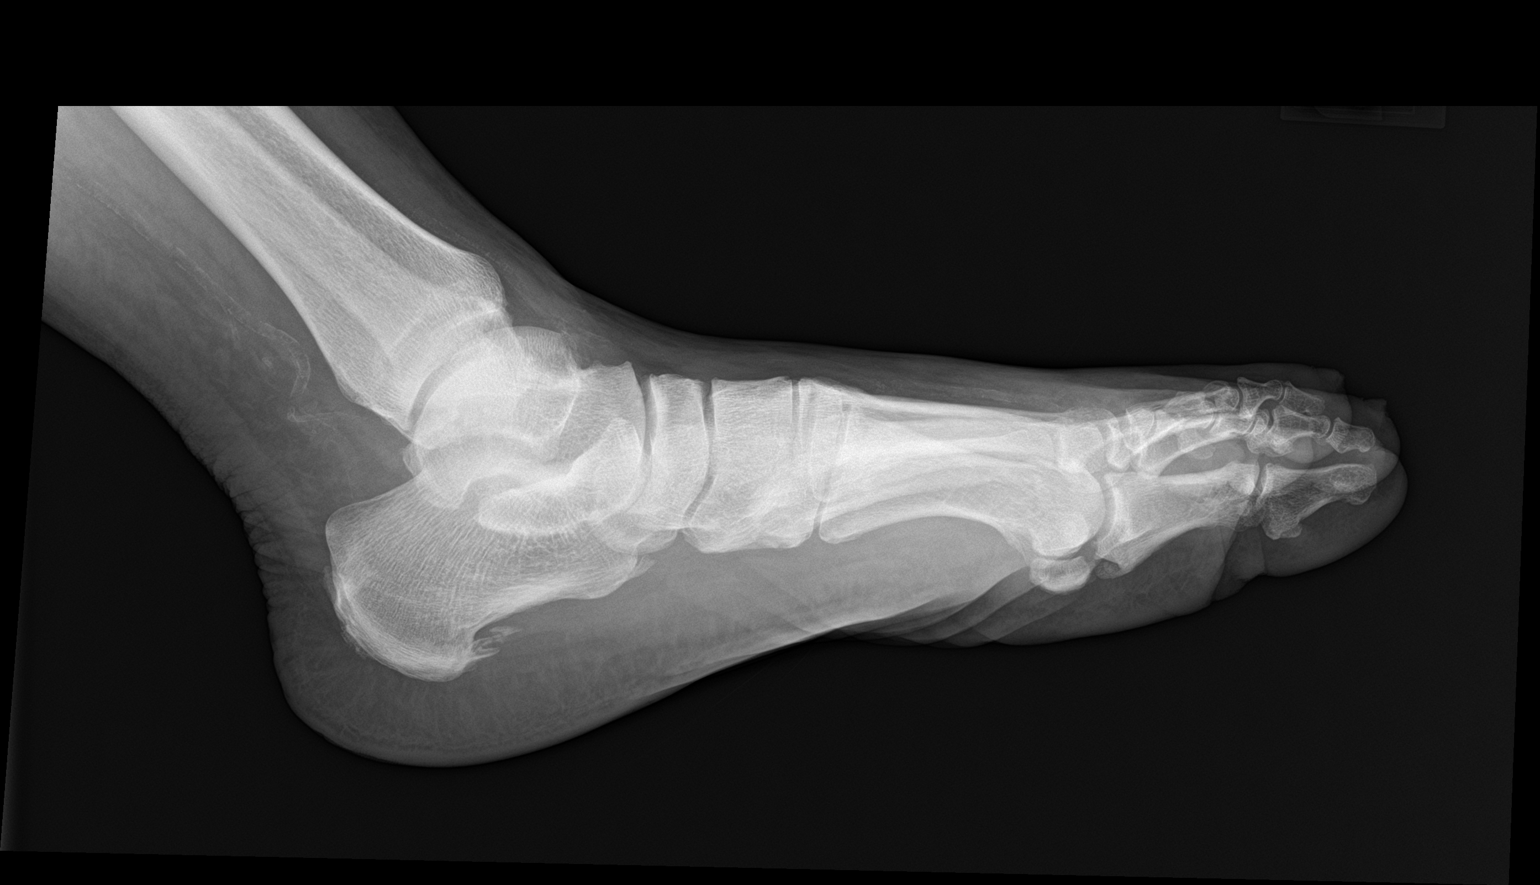

[3 of 3 positions shown; findings below may reference images not displayed]

FINDINGS: Right foot:

Severe medial aspect of the great toe metatarsophalangeal joint
space narrowing with bone-on-bone contact. Minimal lateralization of
the proximal phalanx with respect of the metatarsal head. Moderate
peripheral degenerative osteophytes. Moderate degenerative
osteophytosis of the distal medial malleolus on oblique view.
Moderate plantar calcaneal heel spur. Mild-to-moderate chronic
enthesopathic change at the Achilles insertion on the calcaneus.
Mild distal anterior tibial plafond degenerative spurring. Vascular
calcifications are noted.

Left foot:

Mild great toe metatarsophalangeal joint space narrowing,
subchondral sclerosis and peripheral osteophytosis. Moderate distal
medial malleolar degenerative osteophytosis. Moderate plantar
calcaneal heel spur. Minimal chronic enthesopathic change at the
Achilles insertion on the calcaneus. Vascular calcifications are
noted.

No acute fracture or dislocation.
IMPRESSION: Severe right and mild left great toe metatarsophalangeal joint
osteoarthritis.

Moderate bilateral plantar calcaneal heel spurs.

## 2023-08-30 ENCOUNTER — Other Ambulatory Visit (INDEPENDENT_AMBULATORY_CARE_PROVIDER_SITE_OTHER): Payer: Medicare Other

## 2023-08-30 DIAGNOSIS — I10 Essential (primary) hypertension: Secondary | ICD-10-CM

## 2023-08-30 DIAGNOSIS — E119 Type 2 diabetes mellitus without complications: Secondary | ICD-10-CM | POA: Diagnosis not present

## 2023-08-30 DIAGNOSIS — E785 Hyperlipidemia, unspecified: Secondary | ICD-10-CM

## 2023-08-31 LAB — COMPREHENSIVE METABOLIC PANEL
ALT: 28 U/L (ref 0–53)
AST: 58 U/L — ABNORMAL HIGH (ref 0–37)
Albumin: 4.3 g/dL (ref 3.5–5.2)
Alkaline Phosphatase: 68 U/L (ref 39–117)
BUN: 17 mg/dL (ref 6–23)
CO2: 28 meq/L (ref 19–32)
Calcium: 10.3 mg/dL (ref 8.4–10.5)
Chloride: 99 meq/L (ref 96–112)
Creatinine, Ser: 1.05 mg/dL (ref 0.40–1.50)
GFR: 71.73 mL/min (ref >60.00)
Glucose, Bld: 85 mg/dL (ref 70–99)
Potassium: 4.2 meq/L (ref 3.5–5.1)
Sodium: 139 meq/L (ref 135–145)
Total Bilirubin: 1.3 mg/dL — ABNORMAL HIGH (ref 0.2–1.2)
Total Protein: 7.5 g/dL (ref 6.0–8.3)

## 2023-08-31 LAB — LIPID PANEL
Cholesterol: 165 mg/dL (ref 0–200)
HDL: 43.1 mg/dL (ref >39.00)
LDL Cholesterol: 97 mg/dL (ref 0–99)
NonHDL: 122.26
Total CHOL/HDL Ratio: 4
Triglycerides: 125 mg/dL (ref 0.0–149.0)
VLDL: 25 mg/dL (ref 0.0–40.0)

## 2023-08-31 LAB — HEMOGLOBIN A1C: Hgb A1c MFr Bld: 6.3 % (ref 4.6–6.5)

## 2023-09-05 ENCOUNTER — Ambulatory Visit (INDEPENDENT_AMBULATORY_CARE_PROVIDER_SITE_OTHER): Payer: Medicare Other | Admitting: Medical

## 2023-09-05 VITALS — BP 120/73 | HR 83 | Temp 99.1°F | Resp 18 | Ht 72.0 in | Wt 289.8 lb

## 2023-09-05 DIAGNOSIS — R748 Abnormal levels of other serum enzymes: Secondary | ICD-10-CM

## 2023-09-05 DIAGNOSIS — E119 Type 2 diabetes mellitus without complications: Secondary | ICD-10-CM

## 2023-09-05 DIAGNOSIS — Z7984 Long term (current) use of oral hypoglycemic drugs: Secondary | ICD-10-CM

## 2023-09-05 DIAGNOSIS — E785 Hyperlipidemia, unspecified: Secondary | ICD-10-CM | POA: Diagnosis not present

## 2023-09-05 DIAGNOSIS — I10 Essential (primary) hypertension: Secondary | ICD-10-CM | POA: Diagnosis not present

## 2023-09-05 DIAGNOSIS — Z23 Encounter for immunization: Secondary | ICD-10-CM

## 2023-09-05 NOTE — Patient Instructions (Addendum)
Controlled type 2 diabetes mellitus without complication, without long-term current use of insulin (HCC) -A1c 6.7. Continue metformin xr 500 mg daily.  Hypertension, unspecified type -controlled. Continue amlodipine and zestoretic.  Hyperlipidemia, unspecified hyperlipidemia type -improved lipid panel.  - continue fenofibrate and atorvastatin.  Elevated liver enzymes(well see if any fatty liver. - US Abdomen Limited RUQ (LIVER/GB); Future   Follow up 6 months or sooner if needed.

## 2023-09-05 NOTE — Addendum Note (Signed)
Addended by: Wilford Corner on: 09/05/2023 04:33 PM   Modules accepted: Orders
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
## 2023-09-16 ENCOUNTER — Ambulatory Visit: Payer: Medicare Other | Admitting: Medical

## 2023-09-16 ENCOUNTER — Ambulatory Visit (INDEPENDENT_AMBULATORY_CARE_PROVIDER_SITE_OTHER): Payer: Medicare Other

## 2023-09-16 DIAGNOSIS — Z0000 Encounter for general adult medical examination without abnormal findings: Secondary | ICD-10-CM

## 2023-09-16 NOTE — Patient Instructions (Addendum)
Tyler Maynard , Thank you for taking time to come for your Medicare Wellness Visit. I appreciate your ongoing commitment to your health goals. Please review the following plan we discussed and let me know if I can assist you in the future.   Referrals/Orders/Follow-Ups/Clinician Recommendations: none  This is a list of the screening recommended for you and due dates:  Health Maintenance  Topic Date Due   Hepatitis C Screening  Never done   DTaP/Tdap/Td vaccine (1 - Tdap) Never done   Zoster (Shingles) Vaccine (1 of 2) Never done   Complete foot exam   10/17/2019   Flu Shot  06/30/2023   COVID-19 Vaccine (6 - 2023-24 season) 07/31/2023   Eye exam for diabetics  08/04/2023   Hemoglobin A1C  02/28/2024   Yearly kidney health urinalysis for diabetes  03/17/2024   Yearly kidney function blood test for diabetes  08/29/2024   Medicare Annual Wellness Visit  09/15/2024   Colon Cancer Screening  04/22/2025   Pneumonia Vaccine  Completed   HPV Vaccine  Aged Out    Advanced directives: (ACP Link)Information on Advanced Care Planning can be found at [facility] Advance Health Care Directives Advance Health Care Directives ([email or web address]   Next Medicare Annual Wellness Visit scheduled for next year: Yes   09/18/24 @ 9:40 am by phone
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
## 2023-09-20 ENCOUNTER — Ambulatory Visit (HOSPITAL_BASED_OUTPATIENT_CLINIC_OR_DEPARTMENT_OTHER): Admission: RE | Admit: 2023-09-20 | Payer: Medicare Other | Source: Ambulatory Visit

## 2023-10-03 ENCOUNTER — Ambulatory Visit (HOSPITAL_BASED_OUTPATIENT_CLINIC_OR_DEPARTMENT_OTHER): Payer: Medicare Other

## 2023-10-08 ENCOUNTER — Ambulatory Visit (HOSPITAL_BASED_OUTPATIENT_CLINIC_OR_DEPARTMENT_OTHER)
Admission: RE | Admit: 2023-10-08 | Discharge: 2023-10-08 | Disposition: A | Discharge status: Home / Self Care | Payer: Medicare Other | Source: Ambulatory Visit | Attending: Medical | Admitting: Medical

## 2023-10-08 DIAGNOSIS — R748 Abnormal levels of other serum enzymes: Secondary | ICD-10-CM | POA: Insufficient documentation

## 2023-10-08 DIAGNOSIS — K760 Fatty (change of) liver, not elsewhere classified: Secondary | ICD-10-CM | POA: Diagnosis not present

## 2023-10-21 ENCOUNTER — Telehealth: Payer: Self-pay

## 2023-10-21 NOTE — Patient Outreach (Signed)
Attempted to contact patient regarding dm eye. Left voicemail for patient to return my call at [phone].  Nicholes Rough, CMA Care Guide [employer]

## 2023-11-10 ENCOUNTER — Encounter: Payer: Self-pay | Admitting: Medical

## 2023-11-11 LAB — HM DIABETES EYE EXAM

## 2023-12-26 ENCOUNTER — Other Ambulatory Visit: Payer: Self-pay | Admitting: Medical

## 2024-02-21 ENCOUNTER — Telehealth: Payer: Self-pay | Admitting: Emergency Medicine

## 2024-02-21 NOTE — Telephone Encounter (Signed)
 Spoke with Corrie Dandy , stated verification has already been done

## 2024-02-21 NOTE — Telephone Encounter (Signed)
 Copied from CRM [id number]. Topic: General - Other >> Feb 21, 2024 11:14 AM Desma Mcgregor wrote: Reason for CRM: [facility] is needing the doctor to confirm if the patient has any of the follow conditions: diabetes, cardiovascular, or chronic heart failure. The patient is enrolled in a chronic patient needs plan so the information is required. Reference# [id number].  Please follow up asap.

## 2024-02-27 ENCOUNTER — Telehealth: Payer: Self-pay | Admitting: *Deleted

## 2024-02-27 ENCOUNTER — Other Ambulatory Visit (INDEPENDENT_AMBULATORY_CARE_PROVIDER_SITE_OTHER): Payer: Medicare Other

## 2024-02-27 DIAGNOSIS — I10 Essential (primary) hypertension: Secondary | ICD-10-CM

## 2024-02-27 DIAGNOSIS — E785 Hyperlipidemia, unspecified: Secondary | ICD-10-CM

## 2024-02-27 DIAGNOSIS — E119 Type 2 diabetes mellitus without complications: Secondary | ICD-10-CM | POA: Diagnosis not present

## 2024-02-27 LAB — COMPREHENSIVE METABOLIC PANEL WITH GFR
ALT: 30 U/L (ref 0–53)
AST: 50 U/L — ABNORMAL HIGH (ref 0–37)
Albumin: 4.3 g/dL (ref 3.5–5.2)
Alkaline Phosphatase: 70 U/L (ref 39–117)
BUN: 16 mg/dL (ref 6–23)
CO2: 30 meq/L (ref 19–32)
Calcium: 10.4 mg/dL (ref 8.4–10.5)
Chloride: 100 meq/L (ref 96–112)
Creatinine, Ser: 0.97 mg/dL (ref 0.40–1.50)
GFR: 78.62 mL/min (ref >60.00)
Glucose, Bld: 94 mg/dL (ref 70–99)
Potassium: 4.5 meq/L (ref 3.5–5.1)
Sodium: 141 meq/L (ref 135–145)
Total Bilirubin: 1 mg/dL (ref 0.2–1.2)
Total Protein: 7.5 g/dL (ref 6.0–8.3)

## 2024-02-27 LAB — LIPID PANEL
Cholesterol: 174 mg/dL (ref 0–200)
HDL: 42.3 mg/dL (ref >39.00)
LDL Cholesterol: 105 mg/dL — ABNORMAL HIGH (ref 0–99)
NonHDL: 131.61
Total CHOL/HDL Ratio: 4
Triglycerides: 134 mg/dL (ref 0.0–149.0)
VLDL: 26.8 mg/dL (ref 0.0–40.0)

## 2024-02-27 LAB — HEMOGLOBIN A1C: Hgb A1c MFr Bld: 6.5 % (ref 4.6–6.5)

## 2024-02-27 NOTE — Telephone Encounter (Signed)
 Pt is scheduled for lab appointment today.  There are no future orders in EPIC.  It looks like pt has 6 month follow up with PCP on 03/05/24 so maybe this is for pre-visit labs?  Please place future orders if appropriate.

## 2024-02-27 NOTE — Addendum Note (Signed)
 Addended by: Gwenevere Abbot on: 02/27/2024 08:07 AM   Modules accepted: Orders

## 2024-03-05 ENCOUNTER — Encounter: Payer: Self-pay | Admitting: Medical

## 2024-03-05 ENCOUNTER — Ambulatory Visit (INDEPENDENT_AMBULATORY_CARE_PROVIDER_SITE_OTHER): Payer: Medicare Other | Admitting: Medical

## 2024-03-05 ENCOUNTER — Ambulatory Visit: Payer: Medicare Other | Admitting: Medical

## 2024-03-05 VITALS — BP 121/76 | HR 83 | Ht 72.0 in | Wt 296.4 lb

## 2024-03-05 DIAGNOSIS — E119 Type 2 diabetes mellitus without complications: Secondary | ICD-10-CM

## 2024-03-05 DIAGNOSIS — Z7984 Long term (current) use of oral hypoglycemic drugs: Secondary | ICD-10-CM | POA: Diagnosis not present

## 2024-03-05 DIAGNOSIS — E785 Hyperlipidemia, unspecified: Secondary | ICD-10-CM

## 2024-03-05 DIAGNOSIS — Z125 Encounter for screening for malignant neoplasm of prostate: Secondary | ICD-10-CM | POA: Diagnosis not present

## 2024-03-05 DIAGNOSIS — I10 Essential (primary) hypertension: Secondary | ICD-10-CM | POA: Diagnosis not present

## 2024-03-05 DIAGNOSIS — R350 Frequency of micturition: Secondary | ICD-10-CM
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
## 2024-03-05 NOTE — Patient Instructions (Signed)
 Benign Prostatic Hyperplasia (BPH). Suspect Urinary symptoms suggest BPH. Further evaluation with PSA and urine sample planned.  - Order PSA test and obtain urine sample.(urine studies as future since you could not give sample today.) - Consider prescribing Flomax based on lab results. -possible antibiotic if uti or prostatitis suspected after lab review.  Type 2 Diabetes Mellitus A1c at 6.5% indicates well-managed diabetes. - Continue metformin 500 mg once daily.  Hyperlipidemia LDL at 105 mg/dL. Atorvastatin prescribed to manage cholesterol and reduce atherosclerosis risk. - Continue atorvastatin 40 mg daily.  Hypertension Blood pressure well-controlled. - Continue amlodipine and zestoretic.  Follow up date to be determined after lab review

## 2024-03-06 ENCOUNTER — Encounter: Payer: Self-pay | Admitting: Medical

## 2024-03-06 ENCOUNTER — Other Ambulatory Visit (INDEPENDENT_AMBULATORY_CARE_PROVIDER_SITE_OTHER)

## 2024-03-06 DIAGNOSIS — Z125 Encounter for screening for malignant neoplasm of prostate: Secondary | ICD-10-CM

## 2024-03-06 DIAGNOSIS — R350 Frequency of micturition: Secondary | ICD-10-CM | POA: Diagnosis not present

## 2024-03-06 LAB — POC URINALSYSI DIPSTICK (AUTOMATED)
Bilirubin, UA: NEGATIVE
Blood, UA: NEGATIVE
Glucose, UA: NEGATIVE
Ketones, UA: NEGATIVE
Nitrite, UA: NEGATIVE
Protein, UA: NEGATIVE
Spec Grav, UA: 1.015 (ref 1.010–1.025)
Urobilinogen, UA: 0.2 U/dL
pH, UA: 5 (ref 5.0–8.0)

## 2024-03-06 NOTE — Addendum Note (Signed)
 Addended by: Mervin Kung A on: 03/06/2024 03:35 PM   Modules accepted: Orders

## 2024-03-06 NOTE — Addendum Note (Signed)
 Addended by: Richrd Sox on: 03/06/2024 03:31 PM   Modules accepted: Orders

## 2024-03-07 LAB — URINE CULTURE
MICRO NUMBER:: 16302978
SPECIMEN QUALITY:: ADEQUATE

## 2024-03-07 LAB — PSA: PSA: 3.39 ng/mL (ref 0.10–4.00)

## 2024-03-07 MED ORDER — TAMSULOSIN HCL 0.4 MG PO CAPS: 0.4000 mg | ORAL_CAPSULE | Freq: Every day | ORAL | 3 refills | Status: DC

## 2024-03-07 NOTE — Addendum Note (Signed)
 Addended by: Gwenevere Abbot on: 03/07/2024 02:04 PM   Modules accepted: Orders

## 2024-03-21 ENCOUNTER — Other Ambulatory Visit: Payer: Self-pay | Admitting: Family

## 2024-05-02 ENCOUNTER — Other Ambulatory Visit: Payer: Self-pay | Admitting: Medical

## 2024-06-19 ENCOUNTER — Other Ambulatory Visit (HOSPITAL_BASED_OUTPATIENT_CLINIC_OR_DEPARTMENT_OTHER): Payer: Self-pay

## 2024-06-27 ENCOUNTER — Telehealth: Payer: Self-pay | Admitting: Medical

## 2024-06-27 MED ORDER — LISINOPRIL-HYDROCHLOROTHIAZIDE 20-25 MG PO TABS: 1.0000 | ORAL_TABLET | Freq: Every day | ORAL | 1 refills | Status: DC

## 2024-06-27 MED ORDER — FENOFIBRATE 54 MG PO TABS: ORAL_TABLET | ORAL | 1 refills | Status: DC

## 2024-06-27 MED ORDER — METFORMIN HCL ER 500 MG PO TB24: ORAL_TABLET | ORAL | 1 refills | Status: DC

## 2024-06-27 NOTE — Telephone Encounter (Signed)
 Copied from CRM [id number]. Topic: Clinical - Medication Refill >> Jun 27, 2024  2:40 PM Harlene ORN wrote: Medication: metFORMIN  (GLUCOPHAGE -XR) 500 MG 24 hr tablet, lisinopril -hydrochlorothiazide  (ZESTORETIC ) 20-25 MG tablet, fenofibrate  54 MG tablet  Has the patient contacted their pharmacy? No (Agent: If no, request that the patient contact the pharmacy for the refill. If patient does not wish to contact the pharmacy document the reason why and proceed with request.) (Agent: If yes, when and what did the pharmacy advise?)  This is the patient's preferred pharmacy:  [facility] #[id number], [place] - [address] AT [facility] [address] [place] [address] Phone: [phone] Fax: [phone]  Is this the correct pharmacy for this prescription? Yes If no, delete pharmacy and type the correct one.   Has the prescription been filled recently? No  Is the patient out of the medication? No  Has the patient been seen for an appointment in the last year OR does the patient have an upcoming appointment? Yes  Can we respond through MyChart? Yes  Agent: Please be advised that Rx refills may take up to 3 business days. We ask that you follow-up with your pharmacy.

## 2024-06-27 NOTE — Telephone Encounter (Signed)
 Rx sent.

## 2024-06-30 DIAGNOSIS — H60502 Unspecified acute noninfective otitis externa, left ear: Secondary | ICD-10-CM | POA: Diagnosis not present

## 2024-06-30 DIAGNOSIS — H6120 Impacted cerumen, unspecified ear: Secondary | ICD-10-CM | POA: Diagnosis not present

## 2024-07-25 ENCOUNTER — Other Ambulatory Visit: Payer: Self-pay

## 2024-07-25 ENCOUNTER — Telehealth: Payer: Self-pay

## 2024-07-25 DIAGNOSIS — E785 Hyperlipidemia, unspecified: Secondary | ICD-10-CM

## 2024-07-25 DIAGNOSIS — Z125 Encounter for screening for malignant neoplasm of prostate: Secondary | ICD-10-CM

## 2024-07-25 DIAGNOSIS — E119 Type 2 diabetes mellitus without complications: Secondary | ICD-10-CM

## 2024-07-25 NOTE — Telephone Encounter (Signed)
 Put in lab orders for pts next lab visit on 08/17/2024

## 2024-07-25 NOTE — Telephone Encounter (Signed)
 Copied from CRM [id number]. Topic: Clinical - Request for Lab/Test Order >> Jul 25, 2024  3:36 PM Viola F wrote: Reason for CRM: Patient has appt 08/20/24 and he says he usually gets blood work before his appt, he needs a order put in for diabetes/cholesterol and whatever other lab work he gets tested before his appts

## 2024-08-14 ENCOUNTER — Other Ambulatory Visit: Payer: Self-pay | Admitting: Medical

## 2024-08-14 MED ORDER — TAMSULOSIN HCL 0.4 MG PO CAPS: 0.4000 mg | ORAL_CAPSULE | Freq: Every day | ORAL | 1 refills | Status: DC

## 2024-08-14 NOTE — Telephone Encounter (Signed)
 Copied from CRM [id number]. Topic: Clinical - Medication Refill >> Aug 14, 2024 11:07 AM Franky GRADE wrote: Medication: tamsulosin  (FLOMAX ) 0.4 MG CAPS capsule [[id number]]  Has the patient contacted their pharmacy? Yes, pharmacy is calling to place the request.  (Agent: If no, request that the patient contact the pharmacy for the refill. If patient does not wish to contact the pharmacy document the reason why and proceed with request.) (Agent: If yes, when and what did the pharmacy advise?)  This is the patient's preferred pharmacy:  [facility] #[id number] [address] [facility], [place] - [address] AT [facility] [address] [place] [address] Phone: [phone] Fax: [phone]    Is this the correct pharmacy for this prescription? No If no, delete pharmacy and type the correct one.   Has the prescription been filled recently? No  Is the patient out of the medication? Unaware   Has the patient been seen for an appointment in the last year OR does the patient have an upcoming appointment? Yes  Can we respond through MyChart? Yes  Agent: Please be advised that Rx refills may take up to 3 business days. We ask that you follow-up with your pharmacy.

## 2024-08-17 ENCOUNTER — Other Ambulatory Visit (INDEPENDENT_AMBULATORY_CARE_PROVIDER_SITE_OTHER)

## 2024-08-17 DIAGNOSIS — E785 Hyperlipidemia, unspecified: Secondary | ICD-10-CM

## 2024-08-17 DIAGNOSIS — E119 Type 2 diabetes mellitus without complications: Secondary | ICD-10-CM | POA: Diagnosis not present

## 2024-08-17 DIAGNOSIS — Z125 Encounter for screening for malignant neoplasm of prostate: Secondary | ICD-10-CM

## 2024-08-17 LAB — COMPREHENSIVE METABOLIC PANEL WITH GFR
ALT: 32 U/L (ref 0–53)
AST: 53 U/L — ABNORMAL HIGH (ref 0–37)
Albumin: 4.3 g/dL (ref 3.5–5.2)
Alkaline Phosphatase: 56 U/L (ref 39–117)
BUN: 27 mg/dL — ABNORMAL HIGH (ref 6–23)
CO2: 30 meq/L (ref 19–32)
Calcium: 10.6 mg/dL — ABNORMAL HIGH (ref 8.4–10.5)
Chloride: 101 meq/L (ref 96–112)
Creatinine, Ser: 1.12 mg/dL (ref 0.40–1.50)
GFR: 65.94 mL/min (ref >60.00)
Glucose, Bld: 94 mg/dL (ref 70–99)
Potassium: 4.8 meq/L (ref 3.5–5.1)
Sodium: 141 meq/L (ref 135–145)
Total Bilirubin: 0.9 mg/dL (ref 0.2–1.2)
Total Protein: 7.4 g/dL (ref 6.0–8.3)

## 2024-08-17 LAB — HEMOGLOBIN A1C: Hgb A1c MFr Bld: 6.6 % — ABNORMAL HIGH (ref 4.6–6.5)

## 2024-08-17 LAB — PSA: PSA: 3.95 ng/mL (ref 0.10–4.00)

## 2024-08-18 ENCOUNTER — Ambulatory Visit: Payer: Self-pay | Admitting: Medical

## 2024-08-18 LAB — LIPID PANEL
Cholesterol: 161 mg/dL (ref <200)
HDL: 44 mg/dL (ref >=40)
LDL Cholesterol (Calc): 96 mg/dL
Non-HDL Cholesterol (Calc): 117 mg/dL (ref <130)
Total CHOL/HDL Ratio: 3.7 (calc) (ref <5.0)
Triglycerides: 113 mg/dL (ref <150)

## 2024-08-20 ENCOUNTER — Ambulatory Visit: Admitting: Medical

## 2024-08-20 ENCOUNTER — Encounter: Payer: Self-pay | Admitting: Medical

## 2024-08-20 ENCOUNTER — Other Ambulatory Visit: Payer: Self-pay | Admitting: Emergency Medicine

## 2024-08-20 VITALS — BP 150/80 | HR 77 | Temp 98.1°F | Resp 15 | Ht 72.0 in | Wt 293.8 lb

## 2024-08-20 DIAGNOSIS — E785 Hyperlipidemia, unspecified: Secondary | ICD-10-CM

## 2024-08-20 DIAGNOSIS — R0789 Other chest pain: Secondary | ICD-10-CM

## 2024-08-20 DIAGNOSIS — E119 Type 2 diabetes mellitus without complications: Secondary | ICD-10-CM

## 2024-08-20 DIAGNOSIS — Z7984 Long term (current) use of oral hypoglycemic drugs: Secondary | ICD-10-CM | POA: Diagnosis not present

## 2024-08-20 DIAGNOSIS — R944 Abnormal results of kidney function studies: Secondary | ICD-10-CM

## 2024-08-20 DIAGNOSIS — H9202 Otalgia, left ear: Secondary | ICD-10-CM

## 2024-08-20 DIAGNOSIS — R350 Frequency of micturition: Secondary | ICD-10-CM | POA: Diagnosis not present

## 2024-08-20 DIAGNOSIS — I10 Essential (primary) hypertension: Secondary | ICD-10-CM | POA: Diagnosis not present

## 2024-08-20 DIAGNOSIS — I493 Ventricular premature depolarization: Secondary | ICD-10-CM

## 2024-08-20 DIAGNOSIS — N401 Enlarged prostate with lower urinary tract symptoms: Secondary | ICD-10-CM

## 2024-08-20 MED ORDER — AMLODIPINE BESYLATE 5 MG PO TABS: 5.0000 mg | ORAL_TABLET | Freq: Every day | ORAL | 3 refills | Status: DC
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
## 2024-08-20 NOTE — Patient Instructions (Addendum)
 Atypical chest pain(Musculoskeletal chest and upper back pain) (work-related) and pvcs. Pain likely muscular due to work activity. Cardiac causes need exclusion due to age and risk factors. - Order EKG to compare with previous EKG to rule out cardiac changes.(ekg today shows nsr as before except for rare pvc)  Compared to prior 2023 ekg)  - Advise to avoid heavy lifting and get assistance with the tailgate at work. - Instruct to seek emergency evaluation if pain recurs on the left side. Or be seen if any constant palpitatoins. -Avoid all caffeine beverages. If pvcs persist or getting palpatation then refer to cardiologist.  Hypertension Blood pressure at 150/88 mmHg. Uncertainty about current amlodipine  use. Recent salty food intake may have contributed. - Check if he is currently taking amlodipine  and ensure it is refilled if not. Continue lisinopril /hctz. - Advise to monitor blood pressure at home regularly. - Reassess blood pressure control in the near future.  Decreased gfr GFR 65, slightly decreased from previous 76, possibly due to dehydration. Kidney function stable but needs monitoring. - Encourage adequate hydration to prevent dehydration. - Monitor kidney function regularly.  Type 2 diabetes mellitus Diabetes well controlled with A1c of 6.6%. - Continue metformin  XR 500 mg daily. - Encourage regular exercise to aid in diabetes management.  Hyperlipidemia On atorvastatin  and fenofibrate . - Continue atorvastatin  40 mg daily. - Continue fenofibrate  54 mg daily.  Benign prostatic hyperplasia with lower urinary tract symptoms Experiencing urinary frequency, especially at night. Previously on tamsulosin  with symptom relief. - Ensure tamsulosin  is refilled and taken regularly to manage urinary symptoms.  Cerumen impaction, left ear No significant pain or swelling. - Advise to use over-the-counter ear drops to soften wax if needed in the future. - Consider referral to ENT if  cerumen impaction recurs and cannot be managed in-office.  Follow up with me in 3 weeks or sooner if needed

## 2024-08-20 NOTE — Telephone Encounter (Unsigned)
 Copied from CRM [id number]. Topic: Clinical - Medication Refill >> Aug 20, 2024  5:31 PM Harlene ORN wrote: Medication: amLODipine  (NORVASC ) 2.5 MG tablet  Has the patient contacted their pharmacy? Yes (Agent: If no, request that the patient contact the pharmacy for the refill. If patient does not wish to contact the pharmacy document the reason why and proceed with request.) (Agent: If yes, when and what did the pharmacy advise?)  This is the patient's preferred pharmacy:  [facility] #[id number] [facility],  - [address] AT [facility] [address] [place] [address] Phone: [phone] Fax: [phone]  Is this the correct pharmacy for this prescription? Yes If no, delete pharmacy and type the correct one.   Has the prescription been filled recently? No  Is the patient out of the medication? Yes  Has the patient been seen for an appointment in the last year OR does the patient have an upcoming appointment? Yes  Can we respond through MyChart? Yes  Agent: Please be advised that Rx refills may take up to 3 business days. We ask that you follow-up with your pharmacy.

## 2024-08-21 ENCOUNTER — Telehealth: Payer: Self-pay

## 2024-08-21 NOTE — Telephone Encounter (Signed)
 Initial Comment Caller states he had an appt this morning and states his BP was high 160/90, he states he does not have his BP medication and an Rx was supposed to have been sent in and it was not sent in. Translation No Nurse Assessment Nurse: Junita, RN, Warren Date/Time (Eastern Time): 08/20/2024 6:46:34 PM You have opened med assessment, do you wish to continue? ---Yes Please select the assessment type ---RX called in but not at pharm Additional Documentation ---Caller states he was suppose to receive Amlodipine  and the pharmacy never received it. He has not taken it since 2023 Document the name of the medication. ---Amlodipine  Pharmacy name and phone number. ---[facility] on [address]. [phone] Has the office closed within the last 30 minutes? ---No Does the client directives allow for assistance with medications after hours? ---Yes Is there an on-call physician for the client? ---Yes Nurse: Junita, RN, Warren Date/Time (Eastern Time): 08/20/2024 6:44:38 PM Confirm and document reason for call. If symptomatic, describe symptoms. ---Caller states he was evaluated at the office this morning. His BP was 160/90. He has no new or worsening Sx. He has a slight HA, denies any new or worsening Sx Does the patient have any new or worsening symptoms? ---Yes Will a triage be completed? ---Yes Related visit to physician within the last 2 weeks? ---Yes PLEASE NOTE: All timestamps contained within this report are represented as [place] Standard Time. CONFIDENTIALTY NOTICE: This fax transmission is intended only for the addressee. It contains information that is legally privileged, confidential or otherwise protected from use or disclosure. If you are not the intended recipient, you are strictly prohibited from reviewing, disclosing, copying using or disseminating any of this information or taking any action in reliance on or regarding this information. If you have received this fax in  error, please notify us  immediately by telephone so that we can arrange for its return to us . Phone: [phone], Toll-Free: [phone], Fax: [phone] [name] [birth year] Page: 1 of2 CallId: [id number] Nurse Assessment Does the PT have any chronic conditions? (i.e. diabetes, asthma, this includes High risk factors for pregnancy, etc.) ---Yes List chronic conditions. ---HTN Is this a behavioral health or substance abuse call? ---No Guidelines Guideline Title Affirmed Question Affirmed Notes Nurse Date/Time (Eastern Time) Blood Pressure - High Ran out of BP medications Junita OBIE Warren 08/20/2024 6:49:26 PM Disp. Time [name] Time) Disposition Final User 08/20/2024 6:54:28 PM Call PCP within 24 Hours Yes Junita OBIE Warren 08/20/2024 6:58:38 PM Called On-Call Provider Junita, RN, Warren Final Disposition 08/20/2024 6:54:28 PM Call PCP within 24 Hours Yes Junita, RN, Warren [place] Disagree/Comply Comply Caller Understands Yes PreDisposition InappropriateToAsk Care Advice Given Per Guideline * IF OFFICE WILL BE OPEN: Call the office when it opens tomorrow morning. * You need to discuss this with your doctor (or NP/ PA) within the next 24 hours. * Weakness or numbness of the face, arm or leg on one side of the body occurs * Difficulty walking, difficulty talking, or severe headache occurs * Chest pain or difficulty breathing occurs * You become worse CALL BACK IF: [facility] Phone DateTime Result/ Outcome Message Type Notes Purcell Jules [phone] 08/20/2024 6:58:38 PM Called On Call Provider - Reached Doctor Paged Purcell Jules 08/20/2024 6:59:04 PM Spoke with On Call - General Message Result OCP will send in the RX

## 2024-08-21 NOTE — Telephone Encounter (Signed)
 Rx sent by Dr. Purcell.

## 2024-08-21 NOTE — Telephone Encounter (Signed)
 Rx sent.

## 2024-09-03 ENCOUNTER — Ambulatory Visit: Admitting: Medical

## 2024-09-03 ENCOUNTER — Encounter: Payer: Self-pay | Admitting: Medical

## 2024-09-03 VITALS — BP 150/80 | HR 75 | Temp 98.1°F | Resp 15 | Ht 72.0 in | Wt 288.2 lb

## 2024-09-03 DIAGNOSIS — Z23 Encounter for immunization: Secondary | ICD-10-CM

## 2024-09-03 DIAGNOSIS — I10 Essential (primary) hypertension: Secondary | ICD-10-CM

## 2024-09-03 NOTE — Patient Instructions (Addendum)
 Hypertension Hypertension remains uncontrolled with home readings up to 180/120. Dietary salt intake may contribute. Current regimen includes amlodipine  and lisinopril  HCTZ. Plan to increase amlodipine  to achieve target blood pressure, especially important due to diabetes. - Increase amlodipine  to 10 mg daily using two 5 mg tablets. - Continue lisinopril  HCTZ once daily. - Monitor blood pressure daily, report via MyChart in seven days. - Advise on reducing dietary salt intake, avoid high-salt foods like pork. - Schedule follow-up based on blood pressure readings, typically in three months, sooner if elevated.  Type 2 diabetes mellitus Diabetes is controlled. Blood pressure management is crucial with a target of less than 130/80 mmHg for diabetic patients. - Continue current diabetes management regimen. - Monitor blood pressure closely as part of diabetes management. -continue metformin   Follow up date based on BP update in one week
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
## 2024-09-11 NOTE — Progress Notes (Addendum)
 PRANSHU LYSTER                                          MRN: [id number]   09/11/2024   The [facility] Quality Team Specialist reviewed this patient medical record for the purposes of chart review for care gap closure. The following were reviewed: chart review for care gap closure-kidney health evaluation for diabetes:eGFR  and uACR. ABSTRACTED MOST RECENT A1C. APPT 10/21  Chart reviewed for KED labs after 10/21 appt. No uACR completed.    [facility] Quality Team

## 2024-09-17 ENCOUNTER — Other Ambulatory Visit: Payer: Self-pay | Admitting: Medical

## 2024-09-18 ENCOUNTER — Ambulatory Visit (INDEPENDENT_AMBULATORY_CARE_PROVIDER_SITE_OTHER): Payer: Medicare Other

## 2024-09-18 VITALS — Ht 72.0 in | Wt 288.0 lb

## 2024-09-18 DIAGNOSIS — Z0000 Encounter for general adult medical examination without abnormal findings: Secondary | ICD-10-CM | POA: Diagnosis not present
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
## 2024-09-18 NOTE — Patient Instructions (Addendum)
 Tyler Maynard,  Thank you for taking the time for your Medicare Wellness Visit. I appreciate your continued commitment to your health goals. Please review the care plan we discussed, and feel free to reach out if I can assist you further.  Medicare recommends these wellness visits once per year to help you and your care team stay ahead of potential health issues. These visits are designed to focus on prevention, allowing your provider to concentrate on managing your acute and chronic conditions during your regular appointments.  Please note that Annual Wellness Visits do not include a physical exam. Some assessments may be limited, especially if the visit was conducted virtually. If needed, we may recommend a separate in-person follow-up with your provider.  Ongoing Care Seeing your primary care provider every 3 to 6 months helps us  monitor your health and provide consistent, personalized care.   Referrals If a referral was made during today's visit and you haven't received any updates within two weeks, please contact the referred provider directly to check on the status.  Recommended Screenings:  Health Maintenance  Topic Date Due   Hepatitis C Screening  Never done   DTaP/Tdap/Td vaccine (1 - Tdap) Never done   Complete foot exam   10/17/2019   Yearly kidney health urinalysis for diabetes  03/17/2024   COVID-19 Vaccine (6 - 2025-26 season) 07/30/2024   Zoster (Shingles) Vaccine (1 of 2) 12/04/2024*   Eye exam for diabetics  11/10/2024   Hemoglobin A1C  02/14/2025   Colon Cancer Screening  04/22/2025   Yearly kidney function blood test for diabetes  08/17/2025   Medicare Annual Wellness Visit  09/18/2025   Pneumococcal Vaccine for age over 35  Completed   Flu Shot  Completed   Meningitis B Vaccine  Aged Out  *Topic was postponed. The date shown is not the original due date.       09/18/2024    1:15 PM  Advanced Directives  Does Patient Have a Medical Advance Directive? No   Would patient like information on creating a medical advance directive? No - Patient declined   Advance Care Planning is important because it: Ensures you receive medical care that aligns with your values, goals, and preferences. Provides guidance to your family and loved ones, reducing the emotional burden of decision-making during critical moments.  Vision: Annual vision screenings are recommended for early detection of glaucoma, cataracts, and diabetic retinopathy. These exams can also reveal signs of chronic conditions such as diabetes and high blood pressure.  Dental: Annual dental screenings help detect early signs of oral cancer, gum disease, and other conditions linked to overall health, including heart disease and diabetes.  Please see the attached documents for additional preventive care recommendations.

## 2024-10-09 ENCOUNTER — Telehealth: Payer: Self-pay

## 2024-10-09 NOTE — Telephone Encounter (Signed)
 Copied from CRM [id number]. Topic: Clinical - Medication Question >> Oct 09, 2024  3:59 PM Thersia BROCKS wrote: Reason for CRM: Patient called in regarding medication amLODipine  (NORVASC ) 5 MG tablet , was told to let PA Dallas Maxwell that he is almost done and needs the dosage change sent to the pharmacy

## 2024-10-10 MED ORDER — AMLODIPINE BESYLATE 5 MG PO TABS: 5.0000 mg | ORAL_TABLET | Freq: Every day | ORAL | 3 refills | Status: DC

## 2024-10-10 NOTE — Telephone Encounter (Signed)
Rx refill sent to pt pharmacy 

## 2024-10-10 NOTE — Addendum Note (Signed)
 Addended by: DORINA DALLAS HERO on: 10/10/2024 12:03 PM   Modules accepted: Orders

## 2024-10-11 ENCOUNTER — Other Ambulatory Visit: Payer: Self-pay | Admitting: Medical

## 2024-10-11 ENCOUNTER — Telehealth: Payer: Self-pay | Admitting: Medical

## 2024-10-11 ENCOUNTER — Other Ambulatory Visit: Payer: Self-pay

## 2024-10-11 MED ORDER — AMLODIPINE BESYLATE 10 MG PO TABS: 10.0000 mg | ORAL_TABLET | Freq: Every day | ORAL | 0 refills | Status: DC

## 2024-10-11 NOTE — Telephone Encounter (Signed)
 Copied from CRM #[id number]. Topic: Clinical - Prescription Issue >> Oct 11, 2024  1:26 PM Mercedes MATSU wrote: Reason for CRM: Patient called in stating that when he went to the pharmacy that they said its still too early for him to pick up the medication Amlodipine  5mg . He said that him and Dr. Dorina agreed he would take two pills which is why he ran out, and that the doctor said he would contact the patients insurance and the pharmacy to let them know about the frequency change. Patient is requesting a call back from Dr. Victoriano nurse and can be reached at [phone].

## 2024-10-11 NOTE — Telephone Encounter (Signed)
 Called pharmacy and explained to them what the issue was and that also the rx was wrong

## 2024-10-11 NOTE — Telephone Encounter (Signed)
 Was able to send in the correct dosage of 10 mg amlodipine  to pharmacy they ran it and insurance approved called pt and notified him that everything worked out and he is able to pick up new rx as soon as it is filled

## 2024-10-23 ENCOUNTER — Telehealth: Payer: Self-pay

## 2024-10-23 ENCOUNTER — Ambulatory Visit: Payer: Self-pay

## 2024-10-23 NOTE — Telephone Encounter (Signed)
LMOM asking for call back to schedule appt.  

## 2024-10-23 NOTE — Telephone Encounter (Signed)
 Appt scheduled

## 2024-10-23 NOTE — Telephone Encounter (Signed)
 Patient/caregiver understands and will follow disposition?: Pt states that he is returning a phone call, does not know what it is pertaining to.  States he spoke to nurse last evening and gave all info, states he was calling to schedule. Pt denies ankle swelling at this time. 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 Next available appts 12/1. See RN triage note from 11/24 2216.    Copied from CRM #[id number]. Topic: Clinical - Red Word Triage >> Oct 23, 2024  1:50 PM Rea ORN wrote: Red Word that prompted transfer to Nurse Triage: ankle swelling.
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
## 2024-10-23 NOTE — Telephone Encounter (Signed)
 Apt scheduled for 10/29/24

## 2024-10-29 ENCOUNTER — Ambulatory Visit: Admitting: Medical

## 2024-10-29 ENCOUNTER — Other Ambulatory Visit: Payer: Self-pay | Admitting: Medical

## 2024-10-29 VITALS — BP 138/80 | HR 88 | Temp 98.4°F | Resp 15 | Ht 72.0 in | Wt 299.0 lb

## 2024-10-29 DIAGNOSIS — J40 Bronchitis, not specified as acute or chronic: Secondary | ICD-10-CM | POA: Diagnosis not present

## 2024-10-29 DIAGNOSIS — T7840XA Allergy, unspecified, initial encounter: Secondary | ICD-10-CM

## 2024-10-29 DIAGNOSIS — H1032 Unspecified acute conjunctivitis, left eye: Secondary | ICD-10-CM | POA: Diagnosis not present

## 2024-10-29 MED ORDER — FLUTICASONE PROPIONATE 50 MCG/ACT NA SUSP: 2.0000 | Freq: Every day | NASAL | 1 refills | Status: AC

## 2024-10-29 MED ORDER — METHYLPREDNISOLONE 4 MG PO TABS: ORAL_TABLET | ORAL | 0 refills | Status: AC

## 2024-10-29 MED ORDER — TOBRAMYCIN 0.3 % OP SOLN: 1.0000 [drp] | Freq: Four times a day (QID) | OPHTHALMIC | 0 refills | Status: AC

## 2024-10-29 MED ORDER — BENZONATATE 100 MG PO CAPS: 100.0000 mg | ORAL_CAPSULE | Freq: Three times a day (TID) | ORAL | 0 refills | Status: AC | PRN

## 2024-10-29 MED ORDER — AZITHROMYCIN 250 MG PO TABS: ORAL_TABLET | ORAL | 0 refills | Status: AC

## 2024-10-29 MED ORDER — ATORVASTATIN CALCIUM 40 MG PO TABS: 40.0000 mg | ORAL_TABLET | Freq: Every day | ORAL | 3 refills | Status: AC
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
## 2024-10-29 NOTE — Patient Instructions (Signed)
 Allergic reaction to insect bite with blister formation, right ankle and forearm Allergic reaction suspected due to insect bites with blister formation on right ankle and forearm. Blisters healing, previously larger.  - Prescribed three-day tapered dose of steroids. - Advised to report new blister formation immediately. Explained importance if any new formation occurs  Acute conjunctivitis, left eye Acute conjunctivitis in left eye with thick white discharge and eyelid sticking. - Prescribed Tobrex eye drops.  Acute bronchitis following upper respiratory infection Acute bronchitis post-upper respiratory infection with nasal congestion, sore throat, dry cough, and recent clear phlegm production. - Prescribed azithromycin . - Prescribed benzonatate for cough relief.  Follow up 10 days or sooner if needed

## 2024-11-08 ENCOUNTER — Ambulatory Visit: Admitting: Medical

## 2024-11-16 ENCOUNTER — Other Ambulatory Visit: Payer: Self-pay | Admitting: Medical

## 2024-12-19 ENCOUNTER — Other Ambulatory Visit: Payer: Self-pay | Admitting: Medical

## 2025-01-04 ENCOUNTER — Other Ambulatory Visit: Payer: Self-pay | Admitting: Medical

## 2025-09-24 ENCOUNTER — Ambulatory Visit
